# Patient Record
Sex: Male | Born: 1970
Health system: Southern US, Community
[De-identification: ages and names within clinical notes are randomized; demographics above are authoritative.]

## PROBLEM LIST (undated history)

## (undated) DIAGNOSIS — K219 Gastro-esophageal reflux disease without esophagitis: Secondary | ICD-10-CM

## (undated) DIAGNOSIS — G4730 Sleep apnea, unspecified: Secondary | ICD-10-CM

## (undated) DIAGNOSIS — R011 Cardiac murmur, unspecified: Secondary | ICD-10-CM

## (undated) DIAGNOSIS — E7800 Pure hypercholesterolemia, unspecified: Secondary | ICD-10-CM

## (undated) DIAGNOSIS — I10 Essential (primary) hypertension: Secondary | ICD-10-CM

## (undated) DIAGNOSIS — F419 Anxiety disorder, unspecified: Secondary | ICD-10-CM

## (undated) DIAGNOSIS — R51 Headache: Secondary | ICD-10-CM

## (undated) HISTORY — PX: OTHER SURGICAL HISTORY: SHX169

## (undated) HISTORY — DX: Pure hypercholesterolemia, unspecified: E78.00

## (undated) HISTORY — PX: WISDOM TOOTH EXTRACTION: SHX21

---

## 1997-09-02 ENCOUNTER — Emergency Department (HOSPITAL_COMMUNITY): Admission: EM | Admit: 1997-09-02 | Discharge: 1997-09-02 | Payer: Self-pay | Admitting: Emergency Medicine

## 2000-09-13 ENCOUNTER — Ambulatory Visit (HOSPITAL_COMMUNITY): Admission: RE | Admit: 2000-09-13 | Discharge: 2000-09-13 | Payer: Self-pay | Admitting: Otolaryngology

## 2000-09-13 ENCOUNTER — Encounter: Payer: Self-pay | Admitting: Otolaryngology

## 2001-07-27 ENCOUNTER — Ambulatory Visit (HOSPITAL_BASED_OUTPATIENT_CLINIC_OR_DEPARTMENT_OTHER): Admission: RE | Admit: 2001-07-27 | Discharge: 2001-07-27 | Payer: Self-pay | Admitting: Family Medicine

## 2006-08-01 ENCOUNTER — Ambulatory Visit (HOSPITAL_COMMUNITY): Admission: RE | Admit: 2006-08-01 | Discharge: 2006-08-01 | Payer: Self-pay | Admitting: Family Medicine

## 2007-11-19 ENCOUNTER — Ambulatory Visit (HOSPITAL_COMMUNITY): Admission: RE | Admit: 2007-11-19 | Discharge: 2007-11-19 | Payer: Self-pay | Admitting: Family Medicine

## 2007-11-23 ENCOUNTER — Ambulatory Visit (HOSPITAL_COMMUNITY): Admission: RE | Admit: 2007-11-23 | Discharge: 2007-11-23 | Payer: Self-pay | Admitting: Family Medicine

## 2009-04-15 ENCOUNTER — Ambulatory Visit (HOSPITAL_COMMUNITY): Admission: RE | Admit: 2009-04-15 | Discharge: 2009-04-15 | Payer: Self-pay | Admitting: Family Medicine

## 2009-06-05 ENCOUNTER — Ambulatory Visit (HOSPITAL_COMMUNITY): Admission: RE | Admit: 2009-06-05 | Discharge: 2009-06-05 | Payer: Self-pay | Admitting: Family Medicine

## 2010-05-09 ENCOUNTER — Encounter: Payer: Self-pay | Admitting: Family Medicine

## 2010-05-24 ENCOUNTER — Other Ambulatory Visit (HOSPITAL_COMMUNITY): Payer: Self-pay | Admitting: Family Medicine

## 2010-05-27 ENCOUNTER — Other Ambulatory Visit (HOSPITAL_COMMUNITY): Payer: Self-pay

## 2010-05-31 ENCOUNTER — Ambulatory Visit (HOSPITAL_COMMUNITY)
Admission: RE | Admit: 2010-05-31 | Discharge: 2010-05-31 | Disposition: A | Discharge status: Home / Self Care | Payer: Managed Care, Other (non HMO) | Source: Ambulatory Visit | Attending: Family Medicine | Admitting: Family Medicine

## 2010-05-31 DIAGNOSIS — R109 Unspecified abdominal pain: Secondary | ICD-10-CM | POA: Insufficient documentation

## 2010-07-06 ENCOUNTER — Ambulatory Visit (INDEPENDENT_AMBULATORY_CARE_PROVIDER_SITE_OTHER): Payer: Self-pay | Admitting: Internal Medicine

## 2012-06-06 ENCOUNTER — Encounter (INDEPENDENT_AMBULATORY_CARE_PROVIDER_SITE_OTHER): Payer: Self-pay | Admitting: *Deleted

## 2012-06-28 ENCOUNTER — Encounter (INDEPENDENT_AMBULATORY_CARE_PROVIDER_SITE_OTHER): Payer: Self-pay | Admitting: Internal Medicine

## 2012-06-28 ENCOUNTER — Encounter (INDEPENDENT_AMBULATORY_CARE_PROVIDER_SITE_OTHER): Payer: Self-pay | Admitting: *Deleted

## 2012-06-28 ENCOUNTER — Other Ambulatory Visit (INDEPENDENT_AMBULATORY_CARE_PROVIDER_SITE_OTHER): Payer: Self-pay | Admitting: *Deleted

## 2012-06-28 ENCOUNTER — Ambulatory Visit (INDEPENDENT_AMBULATORY_CARE_PROVIDER_SITE_OTHER): Payer: Managed Care, Other (non HMO) | Admitting: Internal Medicine

## 2012-06-28 VITALS — BP 112/62 | HR 65 | Temp 98.6°F | Ht 70.0 in | Wt 180.6 lb

## 2012-06-28 DIAGNOSIS — E7800 Pure hypercholesterolemia, unspecified: Secondary | ICD-10-CM | POA: Insufficient documentation

## 2012-06-28 DIAGNOSIS — I10 Essential (primary) hypertension: Secondary | ICD-10-CM | POA: Insufficient documentation

## 2012-06-28 DIAGNOSIS — G4730 Sleep apnea, unspecified: Secondary | ICD-10-CM | POA: Insufficient documentation

## 2012-06-28 DIAGNOSIS — K219 Gastro-esophageal reflux disease without esophagitis: Secondary | ICD-10-CM | POA: Insufficient documentation

## 2012-06-28 DIAGNOSIS — G4710 Hypersomnia, unspecified: Secondary | ICD-10-CM | POA: Insufficient documentation

## 2012-06-28 NOTE — Patient Instructions (Signed)
EGD. The risks and benefits such as perforation, bleeding, and infection were reviewed with the patient and is agreeable.The risks and benefits such as perforation, bleeding, and infection were reviewed with the patient and is agreeable. 

## 2012-06-28 NOTE — Progress Notes (Signed)
Subjective:     Patient ID: Terry Moses, male   DOB: 1970/06/24, 42 y.o.   MRN: [id number]  HPI Referred to our office by Dr. Phillips Odor at [facility] for a EGD. For the past 6 months, he tells me he always feels bloated. Started on Omeprazole about 30 days and reflux is somewhat better. He has put on about 15 pounds in the past year.  He feels gassy all the time. He says everytime he eats, he will bloat. No related to any specific food. Appetite is good. He has a BM about twice a day. Normal size. No melena or bright red rectal bleeding.  He also c/o reflux frequently. He has had reflux for a couple of years.Sometimes the reflux was so bad it felt like somebody was stabbing him in his throat. No family hx of colon cancer.  He takes 6 Motrin a day for headaches.    Review of Systems  See hpi. Current Outpatient Prescriptions  Medication Sig Dispense Refill  . aspirin 81 MG tablet Take 81 mg by mouth daily.      Marland Kitchen omeprazole (PRILOSEC) 40 MG capsule Take 40 mg by mouth daily.      . simvastatin (ZOCOR) 20 MG tablet Take 20 mg by mouth every evening.      . valsartan-hydrochlorothiazide (DIOVAN-HCT) 320-25 MG per tablet Take 1 tablet by mouth daily.       No current facility-administered medications for this visit.   Past Medical History  Diagnosis Date  . High cholesterol   . High cholesterol    Past Surgical History  Procedure Laterality Date  . Varicele     Allergies  Allergen Reactions  . Penicillins     .      Objective:   Physical Exam There were no vitals filed for this visit.  Filed Vitals:   06/28/12 0932  BP: 112/62  Pulse: 65  Temp: 98.6 F (37 C)  Height: 5\' 10"  (1.778 m)  Weight: 180 lb 9.6 oz (81.92 kg)     Alert and oriented. Skin warm and dry. Oral mucosa is moist.   . Sclera anicteric, conjunctivae is pink. Thyroid not enlarged. No cervical lymphadenopathy. Lungs clear. Heart regular rate and rhythm.  Abdomen is soft. Bowel sounds are positive.  No hepatomegaly. No abdominal masses felt. No tenderness.  No edema to lower extremities.      Assessment:    GERD. Better now since starting the Omeprazole. Continues to have breakthru acid reflux.    Plan:    EGD.The risks and benefits such as perforation, bleeding, and infection were reviewed with the patient and is agreeable.

## 2012-07-11 ENCOUNTER — Ambulatory Visit (HOSPITAL_COMMUNITY)
Admission: RE | Admit: 2012-07-11 | Discharge: 2012-07-11 | Disposition: A | Discharge status: Home / Self Care | Payer: Managed Care, Other (non HMO) | Source: Ambulatory Visit | Attending: Internal Medicine | Admitting: Internal Medicine

## 2012-07-11 ENCOUNTER — Encounter (HOSPITAL_COMMUNITY)
Admission: RE | Disposition: A | Discharge status: Home / Self Care | Payer: Self-pay | Source: Ambulatory Visit | Attending: Internal Medicine

## 2012-07-11 ENCOUNTER — Encounter (HOSPITAL_COMMUNITY): Payer: Self-pay | Admitting: Pharmacy Technician

## 2012-07-11 ENCOUNTER — Encounter (HOSPITAL_COMMUNITY): Payer: Self-pay | Admitting: *Deleted

## 2012-07-11 DIAGNOSIS — R143 Flatulence: Secondary | ICD-10-CM

## 2012-07-11 DIAGNOSIS — R142 Eructation: Secondary | ICD-10-CM

## 2012-07-11 DIAGNOSIS — K219 Gastro-esophageal reflux disease without esophagitis: Secondary | ICD-10-CM | POA: Insufficient documentation

## 2012-07-11 DIAGNOSIS — K2970 Gastritis, unspecified, without bleeding: Secondary | ICD-10-CM

## 2012-07-11 DIAGNOSIS — R141 Gas pain: Secondary | ICD-10-CM | POA: Insufficient documentation

## 2012-07-11 DIAGNOSIS — Z791 Long term (current) use of non-steroidal anti-inflammatories (NSAID): Secondary | ICD-10-CM

## 2012-07-11 DIAGNOSIS — K2960 Other gastritis without bleeding: Secondary | ICD-10-CM | POA: Insufficient documentation

## 2012-07-11 DIAGNOSIS — I10 Essential (primary) hypertension: Secondary | ICD-10-CM | POA: Insufficient documentation

## 2012-07-11 DIAGNOSIS — K2990 Gastroduodenitis, unspecified, without bleeding: Secondary | ICD-10-CM

## 2012-07-11 HISTORY — DX: Essential (primary) hypertension: I10

## 2012-07-11 HISTORY — PX: ESOPHAGOGASTRODUODENOSCOPY: SHX5428

## 2012-07-11 HISTORY — DX: Anxiety disorder, unspecified: F41.9

## 2012-07-11 HISTORY — DX: Cardiac murmur, unspecified: R01.1

## 2012-07-11 HISTORY — DX: Gastro-esophageal reflux disease without esophagitis: K21.9

## 2012-07-11 HISTORY — DX: Sleep apnea, unspecified: G47.30

## 2012-07-11 HISTORY — DX: Headache: R51

## 2012-07-11 SURGERY — EGD (ESOPHAGOGASTRODUODENOSCOPY)
Anesthesia: Moderate Sedation

## 2012-07-11 MED ORDER — MEPERIDINE HCL 50 MG/ML IJ SOLN
INTRAMUSCULAR | Status: AC
  Filled 2012-07-11: qty 1

## 2012-07-11 MED ORDER — MIDAZOLAM HCL 5 MG/5ML IJ SOLN
INTRAMUSCULAR | Status: DC | PRN
  Administered 2012-07-11: 2 mg via INTRAVENOUS
  Administered 2012-07-11 (×2): 3 mg via INTRAVENOUS

## 2012-07-11 MED ORDER — SODIUM CHLORIDE 0.9 % IV SOLN
INTRAVENOUS | Status: DC
  Administered 2012-07-11: 1000 mL via INTRAVENOUS

## 2012-07-11 MED ORDER — MIDAZOLAM HCL 5 MG/5ML IJ SOLN
INTRAMUSCULAR | Status: AC
  Filled 2012-07-11: qty 10

## 2012-07-11 MED ORDER — STERILE WATER FOR IRRIGATION IR SOLN
Status: DC | PRN
  Administered 2012-07-11: 13:00:00

## 2012-07-11 MED ORDER — BUTAMBEN-TETRACAINE-BENZOCAINE 2-2-14 % EX AERO
INHALATION_SPRAY | CUTANEOUS | Status: DC | PRN
  Administered 2012-07-11: 2 via TOPICAL

## 2012-07-11 MED ORDER — MEPERIDINE HCL 25 MG/ML IJ SOLN
INTRAMUSCULAR | Status: DC | PRN
  Administered 2012-07-11 (×2): 25 mg via INTRAVENOUS

## 2012-07-11 NOTE — Op Note (Signed)
EGD PROCEDURE REPORT  PATIENT:  Terry Moses  MR#:  [id number] Birthdate:  12-13-1970, 42 y.o., male Endoscopist:  Dr. Malissa Hippo, MD Referred By:  Dr. Colette Ribas, MD Procedure Date: 07/11/2012  Procedure:   EGD  Indications:  Patient is 42 year old Caucasian male with atypical symptoms of GERD also has postprandial bloating every time he eats. He has been on daily ibuprofen chronically. He is undergoing diagnostic EGD. He is feeling some better with omeprazole.            Informed Consent:  The risks, benefits, alternatives & imponderables which include, but are not limited to, bleeding, infection, perforation, drug reaction and potential missed lesion have been reviewed.  The potential for biopsy, lesion removal, esophageal dilation, etc. have also been discussed.  Questions have been answered.  All parties agreeable.  Please see history & physical in medical record for more information.  Medications:  Demerol 50 mg IV Versed 8 mg IV Cetacaine spray topically for oropharyngeal anesthesia  Description of procedure:  The endoscope was introduced through the mouth and advanced to the second portion of the duodenum without difficulty or limitations. The mucosal surfaces were surveyed very carefully during advancement of the scope and upon withdrawal.  Findings:  Esophagus:  Mucosa of the esophagus was normal. Wavy but unremarkable GE junction. GEJ:  41 cm Stomach:  Stomach was empty and distended very well with insufflation. Folds in the proximal stomach are normal. Examination of mucosa at body was normal. Patchy antral erythema noted but no erosions or ulcers identified. Pyloric channel was patent. Angularis, fundus and cardia were examined by retroflexing the scope and were normal. Duodenum:  Normal bulbar and post bulbar mucosa.  Therapeutic/Diagnostic Maneuvers Performed:  None  Complications:  None  Impression: No evidence of erosive esophagitis. Nonerosive antral  gastritis no evidence of peptic ulcer disease.  Recommendations:  Continue anti-reflux measures and omeprazole 40 mg by mouth every morning. H. pylori serology. Patient advised to keep ibuprofen use to minimum and discuss other treatment options for chronic headache with Dr. Phillips Odor.  Flynn Lininger U  07/11/2012  1:41 PM  CC: Dr. Phillips Odor, Chancy Hurter, MD & Dr. Bonnetta Barry ref. provider found

## 2012-07-11 NOTE — H&P (Signed)
Terry Moses is an 42 y.o. male.   Chief Complaint: Patient is here for EGD. HPI: Patient is 42 year old Caucasian male who presents with 6 month history of postprandial bloating and intermittent throat pain. He is feeling 50% better with omeprazole. He has been on ibuprofen almost on daily basis 10 years. Lately he is cut back the dose to 600 mg twice daily he takes for headache. He denies nausea vomiting melena rectal bleeding anorexia or weight loss.he smokes about 5 cigarettes per day and trying to quit he drinks alcohol socially but not daily.   Past Medical History  Diagnosis Date  . High cholesterol   . High cholesterol   . Hypertension   . Anxiety   . Sleep apnea   . Heart murmur   . GERD (gastroesophageal reflux disease)   . Headache     Past Surgical History  Procedure Laterality Date  . Varicele      Family History  Problem Relation Age of Onset  . Hyperlipidemia Mother   . Coronary artery disease Father   . COPD Father    Social History:  reports that he has been smoking Cigarettes.  He has a 12.5 pack-year smoking history. He does not have any smokeless tobacco history on file. He reports that  drinks alcohol. He reports that he does not use illicit drugs.  Allergies:  Allergies  Allergen Reactions  . Penicillins Anaphylaxis    Childhood allergy.    Medications Prior to Admission  Medication Sig Dispense Refill  . acetaminophen (TYLENOL) 500 MG tablet Take 1,000 mg by mouth every 6 (six) hours as needed for pain.      Marland Kitchen ibuprofen (ADVIL,MOTRIN) 200 MG tablet Take 200 mg by mouth every 6 (six) hours as needed for pain.      . Multiple Vitamin (MULTIVITAMIN) capsule Take 1 capsule by mouth daily.      Marland Kitchen omeprazole (PRILOSEC) 40 MG capsule Take 40 mg by mouth daily.      . simvastatin (ZOCOR) 20 MG tablet Take 20 mg by mouth every evening.      . valsartan-hydrochlorothiazide (DIOVAN-HCT) 320-25 MG per tablet Take 1 tablet by mouth daily.        No results  found for this or any previous visit (from the past 48 hour(s)). No results found.  ROS  Blood pressure 137/89, pulse 76, temperature 98.2 F (36.8 C), temperature source Oral, resp. rate 18, height 5\' 10"  (1.778 m), weight 180 lb (81.647 kg), SpO2 96.00%. Physical Exam  Constitutional: He appears well-developed and well-nourished.  HENT:  Mouth/Throat: Oropharynx is clear and moist.  Eyes: No scleral icterus.  Neck: No thyromegaly present.  Cardiovascular: Normal rate, regular rhythm and normal heart sounds.   No murmur heard. Respiratory: Effort normal and breath sounds normal.  GI: Soft. He exhibits no distension and no mass. There is no tenderness.  Musculoskeletal: He exhibits no edema.  Lymphadenopathy:    He has no cervical adenopathy.  Neurological: He is alert.  Skin: Skin is warm and dry.     Assessment/Plan Atypical symptoms of GERD . Postprandial bloating . Chronic NSAID use.  Diagnostic EGD.  Shawnell Dykes U 07/11/2012, 1:19 PM

## 2012-07-12 LAB — H. PYLORI ANTIBODY, IGG: H Pylori IgG: 0.52 {ISR}

## 2012-07-13 ENCOUNTER — Encounter (HOSPITAL_COMMUNITY): Payer: Self-pay | Admitting: Internal Medicine

## 2012-07-24 ENCOUNTER — Telehealth (INDEPENDENT_AMBULATORY_CARE_PROVIDER_SITE_OTHER): Payer: Self-pay | Admitting: *Deleted

## 2012-07-24 NOTE — Telephone Encounter (Addendum)
Korvin's call report - reflux is a lot better and has been on the Omeprazole for one month. The bloating, gas and pain is still there but not as bad as before. It is mainly after he eats. Still has an apaitie too.

## 2012-07-24 NOTE — Telephone Encounter (Signed)
Noted, will forward to Dr.Rehman to review (This is a Progress report)

## 2012-08-02 NOTE — Telephone Encounter (Signed)
Patient called and given Dr.Rehman's recommendation. Forwarded to Lupita Leash to make appointment for 1 month.

## 2012-08-02 NOTE — Telephone Encounter (Signed)
LM for patient to return the call to schedule a f/u apt.   

## 2012-08-02 NOTE — Telephone Encounter (Signed)
Since he is feeling better would continue with current therapy and plan to see him in office in one month. Please call patient

## 2012-08-15 NOTE — Telephone Encounter (Signed)
LM for patient to return the call to schedule a f/u apt.   

## 2012-08-21 ENCOUNTER — Encounter (INDEPENDENT_AMBULATORY_CARE_PROVIDER_SITE_OTHER): Payer: Self-pay | Admitting: Internal Medicine

## 2012-08-21 ENCOUNTER — Ambulatory Visit (INDEPENDENT_AMBULATORY_CARE_PROVIDER_SITE_OTHER): Payer: Managed Care, Other (non HMO) | Admitting: Internal Medicine

## 2012-08-21 VITALS — BP 110/80 | HR 76 | Temp 97.5°F | Resp 18 | Ht 70.0 in | Wt 176.5 lb

## 2012-08-21 DIAGNOSIS — R140 Abdominal distension (gaseous): Secondary | ICD-10-CM

## 2012-08-21 DIAGNOSIS — R143 Flatulence: Secondary | ICD-10-CM

## 2012-08-21 DIAGNOSIS — K219 Gastro-esophageal reflux disease without esophagitis: Secondary | ICD-10-CM

## 2012-08-21 MED ORDER — ACETAMINOPHEN 500 MG PO TABS: 1000.0000 mg | ORAL_TABLET | Freq: Three times a day (TID) | ORAL | Status: DC | PRN

## 2012-08-21 NOTE — Patient Instructions (Signed)
Call if symptoms relapse. Keep Tylenol use to 3 g or less per day.

## 2012-08-21 NOTE — Progress Notes (Signed)
Presenting complaint;  Followup for GERD and bloating.  Subjective:  Patient is 42 year old Caucasian male was initially seen about 8 weeks ago for postprandial bloating and history of heartburn. He did not is respond well to PPI. Ultrasound was negative for cholelithiasis. He underwent EGD on 07/11/2012 and noted to have nonerosive antral gastritis. H. pylori serology was negative. He was advised to continue antireflux measures and omeprazole. He was also advised to decrease ibuprofen use. Overall he feels better. He is having heartburn once or twice a week. His bloating is also decreased significantly but not completely gone away. He continues to have daily headaches. He is trying to take 2 ibuprofen on most days and on some he takes 3. He denies abdominal pain melena rectal bleeding or diarrhea. He believes his headache is secondary to sinusitis. He is agreeable to follow with Dr. Phillips Odor if headache is worse. He states his sleep study 3 years ago and was diagnosed with sleep apnea and he is using CPAP. this intervention has not helped his headache.  Current Medications: Current Outpatient Prescriptions  Medication Sig Dispense Refill  . acetaminophen (TYLENOL) 500 MG tablet Take 1,000 mg by mouth every 6 (six) hours as needed for pain.      Marland Kitchen ibuprofen (ADVIL,MOTRIN) 200 MG tablet Take 200 mg by mouth every 6 (six) hours as needed for pain.      . Multiple Vitamin (MULTIVITAMIN) capsule Take 1 capsule by mouth daily.      Marland Kitchen omeprazole (PRILOSEC) 40 MG capsule Take 40 mg by mouth daily.      . simvastatin (ZOCOR) 20 MG tablet Take 20 mg by mouth every evening.      . valsartan-hydrochlorothiazide (DIOVAN-HCT) 320-25 MG per tablet Take 1 tablet by mouth daily.       No current facility-administered medications for this visit.     Objective: Blood pressure 110/80, pulse 76, temperature 97.5 F (36.4 C), temperature source Oral, resp. rate 18, height 5\' 10"  (1.778 m), weight 176 lb 8 oz  (80.06 kg). Patient is alert and in no acute distress. Conjunctiva is pink. Sclera is nonicteric Oropharyngeal mucosa is normal. No neck masses or thyromegaly noted. Abdomen is soft and nontender without organomegaly or masses. No LE edema or clubbing noted.  Labs/studies Results: H. pylori serology was negative on 07/11/2012.  Assessment:  #1. GERD. Typical symptoms are well controlled with 40 mg of omeprazole daily. At some point in future dose would be decreased. #2. Bloating. This symptom may be related to aerophagia or consumption of flatulogenic foods. Ultrasound was negative for cholelithiasis. If this symptom worsens again we will consider HIDA scan with CCK and evaluation for SIBO.    Plan:  Continue antireflux measures. Continue omeprazole at 40 mg by mouth every morning. Keep ibuprofen use to minimum. Can alternate with Tylenol. Patient advised not to take more than 3 g of Tylenol on any given day. Office visit in 6 months unless symptoms worsen.

## 2013-02-19 ENCOUNTER — Ambulatory Visit (INDEPENDENT_AMBULATORY_CARE_PROVIDER_SITE_OTHER): Payer: Managed Care, Other (non HMO) | Admitting: Internal Medicine

## 2015-06-01 DIAGNOSIS — Z1389 Encounter for screening for other disorder: Secondary | ICD-10-CM | POA: Diagnosis not present

## 2015-06-01 DIAGNOSIS — E782 Mixed hyperlipidemia: Secondary | ICD-10-CM | POA: Diagnosis not present

## 2015-06-01 DIAGNOSIS — Z719 Counseling, unspecified: Secondary | ICD-10-CM | POA: Diagnosis not present

## 2015-06-01 DIAGNOSIS — E663 Overweight: Secondary | ICD-10-CM | POA: Diagnosis not present

## 2015-06-01 DIAGNOSIS — Z6826 Body mass index (BMI) 26.0-26.9, adult: Secondary | ICD-10-CM | POA: Diagnosis not present

## 2015-06-12 ENCOUNTER — Other Ambulatory Visit (HOSPITAL_COMMUNITY): Payer: Self-pay | Admitting: Family Medicine

## 2015-06-19 MED FILL — OMEPRAZOLE DR 40 MG CAPSULE: 40 | 90 days supply | Qty: 90 | Fill #1

## 2015-07-21 MED FILL — PRAVASTATIN SODIUM 20 MG TA: 20 | 90 days supply | Qty: 90 | Fill #1

## 2015-10-02 MED FILL — OMEPRAZOLE DR 40 MG CAPSULE: 40 | 90 days supply | Qty: 90 | Fill #2

## 2015-11-13 MED FILL — PRAVASTATIN SODIUM 20 MG TA: 20 | 90 days supply | Qty: 90 | Fill #2

## 2016-01-06 MED FILL — VALSARTAN-HCTZ 320-25 MG TA: 320-25 | 90 days supply | Qty: 90 | Fill #1

## 2016-01-12 MED FILL — OMEPRAZOLE DR 40 MG CAPSULE: 40 | 90 days supply | Qty: 90 | Fill #3

## 2016-03-22 DIAGNOSIS — Z0001 Encounter for general adult medical examination with abnormal findings: Secondary | ICD-10-CM | POA: Diagnosis not present

## 2016-03-22 DIAGNOSIS — E785 Hyperlipidemia, unspecified: Secondary | ICD-10-CM | POA: Diagnosis not present

## 2016-03-22 DIAGNOSIS — I10 Essential (primary) hypertension: Secondary | ICD-10-CM | POA: Diagnosis not present

## 2016-03-22 DIAGNOSIS — K589 Irritable bowel syndrome without diarrhea: Secondary | ICD-10-CM | POA: Diagnosis not present

## 2016-03-22 DIAGNOSIS — Z1389 Encounter for screening for other disorder: Secondary | ICD-10-CM | POA: Diagnosis not present

## 2016-03-22 DIAGNOSIS — R51 Headache: Secondary | ICD-10-CM | POA: Diagnosis not present

## 2016-03-22 DIAGNOSIS — Z719 Counseling, unspecified: Secondary | ICD-10-CM | POA: Diagnosis not present

## 2016-03-22 DIAGNOSIS — Z6824 Body mass index (BMI) 24.0-24.9, adult: Secondary | ICD-10-CM | POA: Diagnosis not present

## 2016-03-22 MED FILL — PRAVASTATIN SODIUM 20 MG TA: 20 | 90 days supply | Qty: 90 | Fill #0

## 2016-03-22 MED FILL — TOPIRAMATE 25 MG TABLET: 25 | 30 days supply | Qty: 60 | Fill #0

## 2016-03-22 MED FILL — VALSARTAN-HCTZ 320-25 MG TA: 320-25 | 90 days supply | Qty: 90 | Fill #0

## 2016-03-23 ENCOUNTER — Other Ambulatory Visit (HOSPITAL_COMMUNITY): Payer: Self-pay | Admitting: Family Medicine

## 2016-03-23 DIAGNOSIS — K219 Gastro-esophageal reflux disease without esophagitis: Secondary | ICD-10-CM | POA: Diagnosis not present

## 2016-03-23 DIAGNOSIS — K589 Irritable bowel syndrome without diarrhea: Secondary | ICD-10-CM | POA: Diagnosis not present

## 2016-03-23 DIAGNOSIS — Z0001 Encounter for general adult medical examination with abnormal findings: Secondary | ICD-10-CM | POA: Diagnosis not present

## 2016-03-23 DIAGNOSIS — Z1389 Encounter for screening for other disorder: Secondary | ICD-10-CM | POA: Diagnosis not present

## 2016-04-13 MED FILL — OMEPRAZOLE DR 40 MG CAPSULE: 40 | 90 days supply | Qty: 90 | Fill #0

## 2016-05-03 DIAGNOSIS — D225 Melanocytic nevi of trunk: Secondary | ICD-10-CM | POA: Diagnosis not present

## 2016-05-03 DIAGNOSIS — D485 Neoplasm of uncertain behavior of skin: Secondary | ICD-10-CM | POA: Diagnosis not present

## 2016-05-03 DIAGNOSIS — Z1283 Encounter for screening for malignant neoplasm of skin: Secondary | ICD-10-CM | POA: Diagnosis not present

## 2016-05-03 DIAGNOSIS — L218 Other seborrheic dermatitis: Secondary | ICD-10-CM | POA: Diagnosis not present

## 2016-05-05 MED FILL — FLUTICASONE PROP 0.05% CRM: 0.05 | 30 days supply | Qty: 60 | Fill #0

## 2016-05-11 DIAGNOSIS — D485 Neoplasm of uncertain behavior of skin: Secondary | ICD-10-CM | POA: Diagnosis not present

## 2016-05-11 DIAGNOSIS — D2261 Melanocytic nevi of right upper limb, including shoulder: Secondary | ICD-10-CM | POA: Diagnosis not present

## 2016-05-11 DIAGNOSIS — L98429 Non-pressure chronic ulcer of back with unspecified severity: Secondary | ICD-10-CM | POA: Diagnosis not present

## 2016-07-21 MED FILL — OMEPRAZOLE DR 40 MG CAPSULE: 40 | 90 days supply | Qty: 90 | Fill #1

## 2016-09-01 MED FILL — PRAVASTATIN SODIUM 20 MG TA: 20 | 90 days supply | Qty: 90 | Fill #1

## 2016-10-26 MED FILL — OMEPRAZOLE DR 40 MG CAPSULE: 40 | 90 days supply | Qty: 90 | Fill #2

## 2017-02-03 MED FILL — OMEPRAZOLE DR 40 MG CAPSULE: 40 | 90 days supply | Qty: 90 | Fill #3

## 2017-03-23 DIAGNOSIS — Z0000 Encounter for general adult medical examination without abnormal findings: Secondary | ICD-10-CM | POA: Diagnosis not present

## 2017-03-23 DIAGNOSIS — Z1389 Encounter for screening for other disorder: Secondary | ICD-10-CM | POA: Diagnosis not present

## 2017-03-23 DIAGNOSIS — H524 Presbyopia: Secondary | ICD-10-CM | POA: Diagnosis not present

## 2017-03-23 DIAGNOSIS — Z23 Encounter for immunization: Secondary | ICD-10-CM | POA: Diagnosis not present

## 2017-03-23 DIAGNOSIS — Z6824 Body mass index (BMI) 24.0-24.9, adult: Secondary | ICD-10-CM | POA: Diagnosis not present

## 2017-03-23 DIAGNOSIS — G4730 Sleep apnea, unspecified: Secondary | ICD-10-CM | POA: Diagnosis not present

## 2017-03-23 DIAGNOSIS — K219 Gastro-esophageal reflux disease without esophagitis: Secondary | ICD-10-CM | POA: Diagnosis not present

## 2017-03-23 DIAGNOSIS — K589 Irritable bowel syndrome without diarrhea: Secondary | ICD-10-CM | POA: Diagnosis not present

## 2017-03-23 DIAGNOSIS — R7309 Other abnormal glucose: Secondary | ICD-10-CM | POA: Diagnosis not present

## 2017-03-24 MED FILL — ROSUVASTATIN CALCIUM 20 MG: 20 | 90 days supply | Qty: 90 | Fill #0 | Status: TO

## 2017-04-12 ENCOUNTER — Ambulatory Visit (INDEPENDENT_AMBULATORY_CARE_PROVIDER_SITE_OTHER): Payer: Managed Care, Other (non HMO) | Admitting: Internal Medicine

## 2017-04-27 MED FILL — VALSARTAN-HCTZ 320-25 MG TA: 320-25 | 90 days supply | Qty: 90 | Fill #0 | Status: TO

## 2017-04-27 MED FILL — OMEPRAZOLE DR 40 MG CAPSULE: 40 | 90 days supply | Qty: 90 | Fill #0 | Status: TO

## 2017-06-29 MED FILL — ROSUVASTATIN CALCIUM 20 MG: 20 | 90 days supply | Qty: 90 | Fill #0

## 2017-06-29 MED FILL — SHIPPING COST: 1 days supply | Qty: 1 | Fill #0

## 2017-08-30 MED FILL — OMEPRAZOLE 40 MG CPDR: 40 | 90 days supply | Qty: 90 | Fill #0

## 2017-08-30 MED FILL — SHIPPING COST: 1 days supply | Qty: 1 | Fill #1

## 2017-10-11 MED FILL — ROSUVASTATIN CALCIUM 20 MG: 20 | 90 days supply | Qty: 90 | Fill #0

## 2017-10-12 MED FILL — SHIPPING COST: 1 days supply | Qty: 1 | Fill #2

## 2017-11-20 MED FILL — VALSARTAN-HCTZ 320-25 MG TA: 320-25 | 90 days supply | Qty: 90 | Fill #0

## 2017-11-20 MED FILL — SHIPPING COST: 1 days supply | Qty: 1 | Fill #3

## 2017-11-20 MED FILL — OMEPRAZOLE 40 MG CPDR: 40 | 90 days supply | Qty: 90 | Fill #1

## 2017-11-27 DIAGNOSIS — E785 Hyperlipidemia, unspecified: Secondary | ICD-10-CM | POA: Diagnosis not present

## 2017-11-27 DIAGNOSIS — Z6825 Body mass index (BMI) 25.0-25.9, adult: Secondary | ICD-10-CM | POA: Diagnosis not present

## 2017-11-27 DIAGNOSIS — E663 Overweight: Secondary | ICD-10-CM | POA: Diagnosis not present

## 2017-11-27 DIAGNOSIS — Z1389 Encounter for screening for other disorder: Secondary | ICD-10-CM | POA: Diagnosis not present

## 2017-11-27 DIAGNOSIS — E782 Mixed hyperlipidemia: Secondary | ICD-10-CM | POA: Diagnosis not present

## 2017-12-11 DIAGNOSIS — Z1389 Encounter for screening for other disorder: Secondary | ICD-10-CM | POA: Diagnosis not present

## 2017-12-11 DIAGNOSIS — E663 Overweight: Secondary | ICD-10-CM | POA: Diagnosis not present

## 2017-12-11 DIAGNOSIS — L239 Allergic contact dermatitis, unspecified cause: Secondary | ICD-10-CM | POA: Diagnosis not present

## 2017-12-11 DIAGNOSIS — Z6825 Body mass index (BMI) 25.0-25.9, adult: Secondary | ICD-10-CM | POA: Diagnosis not present

## 2018-02-13 MED FILL — SHIPPING COST: 1 days supply | Qty: 1 | Fill #4

## 2018-02-13 MED FILL — ROSUVASTATIN CALCIUM 20 MG: 20 | 90 days supply | Qty: 90 | Fill #0

## 2018-02-15 DIAGNOSIS — J390 Retropharyngeal and parapharyngeal abscess: Secondary | ICD-10-CM | POA: Diagnosis not present

## 2018-02-15 DIAGNOSIS — Z6825 Body mass index (BMI) 25.0-25.9, adult: Secondary | ICD-10-CM | POA: Diagnosis not present

## 2018-02-15 DIAGNOSIS — Z1389 Encounter for screening for other disorder: Secondary | ICD-10-CM | POA: Diagnosis not present

## 2018-02-15 DIAGNOSIS — E663 Overweight: Secondary | ICD-10-CM | POA: Diagnosis not present

## 2018-03-01 ENCOUNTER — Ambulatory Visit (INDEPENDENT_AMBULATORY_CARE_PROVIDER_SITE_OTHER): Payer: 59 | Admitting: Otolaryngology

## 2018-03-01 DIAGNOSIS — Q892 Congenital malformations of other endocrine glands: Secondary | ICD-10-CM | POA: Diagnosis not present

## 2018-03-01 DIAGNOSIS — R070 Pain in throat: Secondary | ICD-10-CM

## 2018-03-02 ENCOUNTER — Other Ambulatory Visit (INDEPENDENT_AMBULATORY_CARE_PROVIDER_SITE_OTHER): Payer: Self-pay | Admitting: Otolaryngology

## 2018-03-02 DIAGNOSIS — R221 Localized swelling, mass and lump, neck: Secondary | ICD-10-CM

## 2018-03-09 MED FILL — SHIPPING COST: 1 days supply | Qty: 1 | Fill #5

## 2018-03-09 MED FILL — OMEPRAZOLE 40 MG CPDR: 40 | 90 days supply | Qty: 90 | Fill #2

## 2018-03-16 ENCOUNTER — Ambulatory Visit (HOSPITAL_COMMUNITY)
Admission: RE | Admit: 2018-03-16 | Discharge: 2018-03-16 | Disposition: A | Discharge status: Home / Self Care | Payer: 59 | Source: Ambulatory Visit | Attending: Otolaryngology | Admitting: Otolaryngology

## 2018-03-16 ENCOUNTER — Encounter (HOSPITAL_COMMUNITY): Payer: Self-pay

## 2018-03-16 DIAGNOSIS — R221 Localized swelling, mass and lump, neck: Secondary | ICD-10-CM | POA: Diagnosis not present

## 2018-03-16 DIAGNOSIS — Q892 Congenital malformations of other endocrine glands: Secondary | ICD-10-CM | POA: Diagnosis not present

## 2018-03-16 LAB — POCT I-STAT CREATININE: Creatinine, Ser: 1 mg/dL (ref 0.61–1.24)

## 2018-03-16 MED ORDER — IOHEXOL 300 MG/ML  SOLN
75.0000 mL | Freq: Once | INTRAMUSCULAR | Status: AC | PRN
  Administered 2018-03-16: 75 mL via INTRAVENOUS

## 2018-04-02 DIAGNOSIS — K589 Irritable bowel syndrome without diarrhea: Secondary | ICD-10-CM | POA: Diagnosis not present

## 2018-04-02 DIAGNOSIS — Z720 Tobacco use: Secondary | ICD-10-CM | POA: Diagnosis not present

## 2018-04-02 DIAGNOSIS — I10 Essential (primary) hypertension: Secondary | ICD-10-CM | POA: Diagnosis not present

## 2018-04-02 DIAGNOSIS — Q892 Congenital malformations of other endocrine glands: Secondary | ICD-10-CM | POA: Diagnosis not present

## 2018-04-02 DIAGNOSIS — E782 Mixed hyperlipidemia: Secondary | ICD-10-CM | POA: Diagnosis not present

## 2018-04-02 DIAGNOSIS — R7309 Other abnormal glucose: Secondary | ICD-10-CM | POA: Diagnosis not present

## 2018-04-02 DIAGNOSIS — R001 Bradycardia, unspecified: Secondary | ICD-10-CM | POA: Diagnosis not present

## 2018-04-02 DIAGNOSIS — R5383 Other fatigue: Secondary | ICD-10-CM | POA: Diagnosis not present

## 2018-04-02 DIAGNOSIS — E663 Overweight: Secondary | ICD-10-CM | POA: Diagnosis not present

## 2018-04-02 DIAGNOSIS — Z0001 Encounter for general adult medical examination with abnormal findings: Secondary | ICD-10-CM | POA: Diagnosis not present

## 2018-04-02 DIAGNOSIS — Z23 Encounter for immunization: Secondary | ICD-10-CM | POA: Diagnosis not present

## 2018-04-02 DIAGNOSIS — Z1389 Encounter for screening for other disorder: Secondary | ICD-10-CM | POA: Diagnosis not present

## 2018-04-02 DIAGNOSIS — Z6825 Body mass index (BMI) 25.0-25.9, adult: Secondary | ICD-10-CM | POA: Diagnosis not present

## 2018-04-02 MED FILL — VALSARTAN 160 MG TABLET: 160 | 90 days supply | Qty: 90 | Fill #0

## 2018-04-02 MED FILL — SHIPPING COST: 1 days supply | Qty: 1 | Fill #6

## 2018-04-05 ENCOUNTER — Ambulatory Visit (INDEPENDENT_AMBULATORY_CARE_PROVIDER_SITE_OTHER): Payer: 59 | Admitting: Otolaryngology

## 2018-04-05 DIAGNOSIS — Q892 Congenital malformations of other endocrine glands: Secondary | ICD-10-CM

## 2019-03-28 DIAGNOSIS — Z719 Counseling, unspecified: Secondary | ICD-10-CM | POA: Diagnosis not present

## 2019-03-28 DIAGNOSIS — Z6824 Body mass index (BMI) 24.0-24.9, adult: Secondary | ICD-10-CM | POA: Diagnosis not present

## 2019-03-28 DIAGNOSIS — R001 Bradycardia, unspecified: Secondary | ICD-10-CM | POA: Diagnosis not present

## 2019-03-28 DIAGNOSIS — Z0001 Encounter for general adult medical examination with abnormal findings: Secondary | ICD-10-CM | POA: Diagnosis not present

## 2019-03-28 DIAGNOSIS — E785 Hyperlipidemia, unspecified: Secondary | ICD-10-CM | POA: Diagnosis not present

## 2019-03-28 DIAGNOSIS — I10 Essential (primary) hypertension: Secondary | ICD-10-CM | POA: Diagnosis not present

## 2019-04-03 ENCOUNTER — Encounter (INDEPENDENT_AMBULATORY_CARE_PROVIDER_SITE_OTHER): Payer: Self-pay | Admitting: *Deleted

## 2019-12-31 IMAGING — CT CT NECK W/ CM
4 of 5 series · 16 of 33 positions shown, 18 images · IV contrast (omnipaque)
Comparison: None.

CLINICAL DATA: Anterior neck mass

EXAM:
CT NECK WITH CONTRAST
TECHNIQUE: Multidetector CT imaging of the neck was performed using the
standard protocol following the bolus administration of intravenous
contrast.
CONTRAST:  75mL OMNIPAQUE IOHEXOL 300 MG/ML  SOLN

[Series 2: axial neck · axial · 0.58mm/px · z∈[+1233,+1389]mm · 4 of 130 slices shown, 5 images]
[im 26/130  soft-tissue]
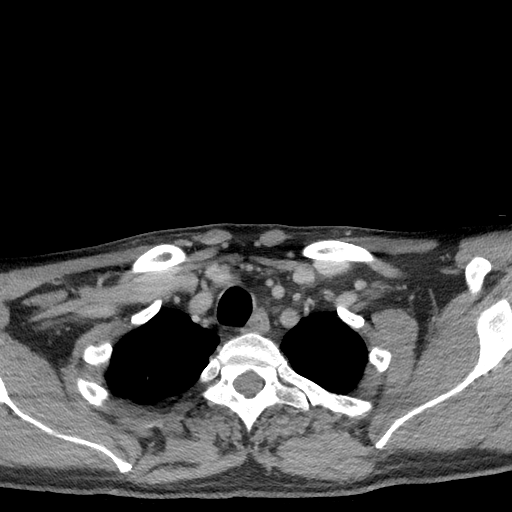
[im 26/130  bone]
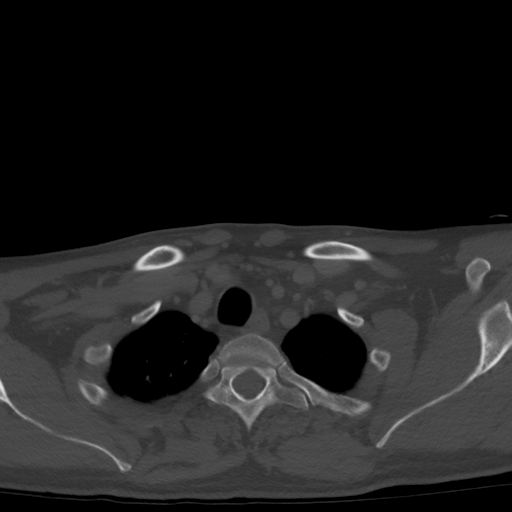
[im 52/130  bone]
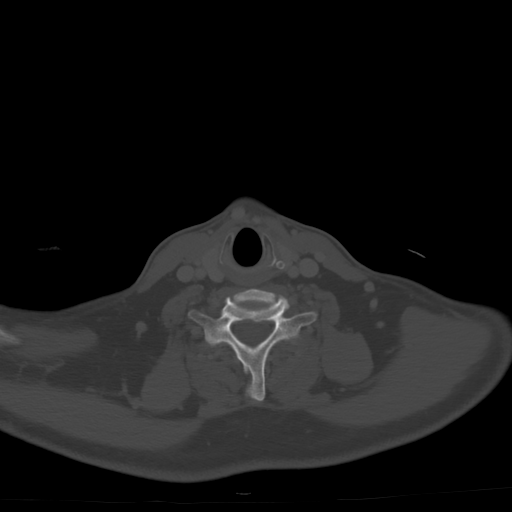
[im 78/130  bone]
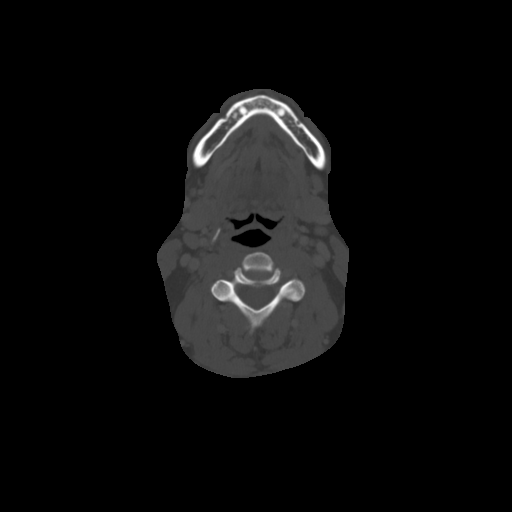
[im 104/130  bone]
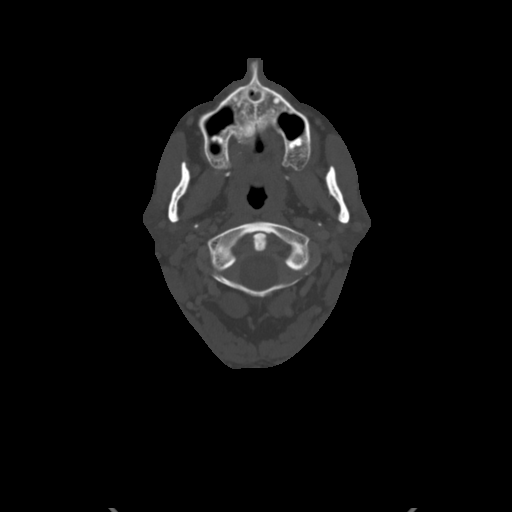

[Series 6: coronal neck · coronal · 0.52mm/px · 3 of 134 slices shown]
[im 27/134  bone]
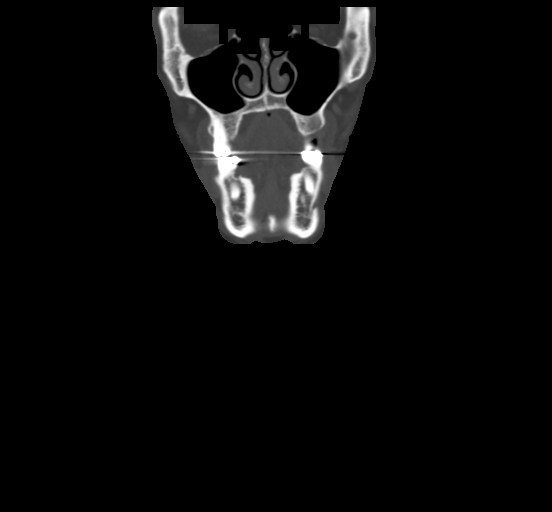
[im 54/134  bone]
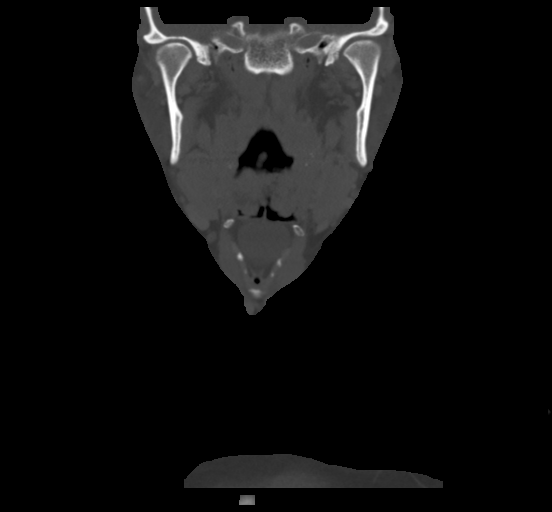
[im 80/134  bone]
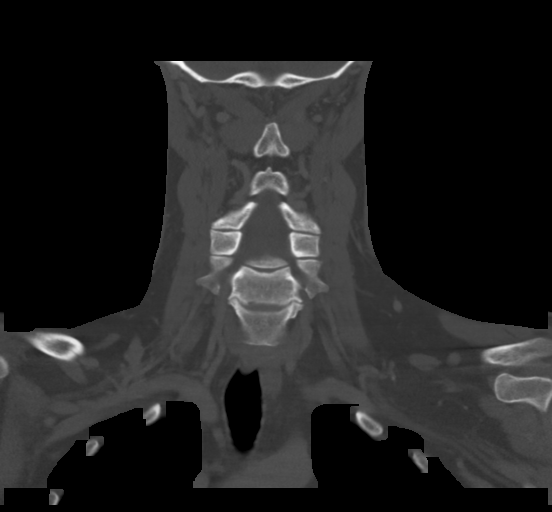

[Series 7: sagittal neck · sagittal · 0.52mm/px · 5 of 105 slices shown, 6 images]
[im 35/105  bone]
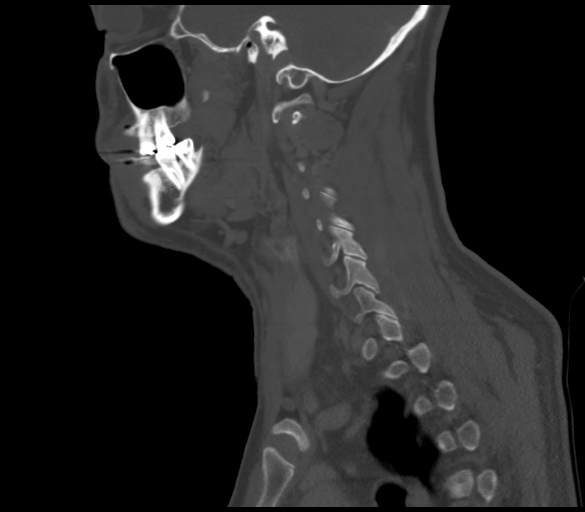
[im 44/105  bone]
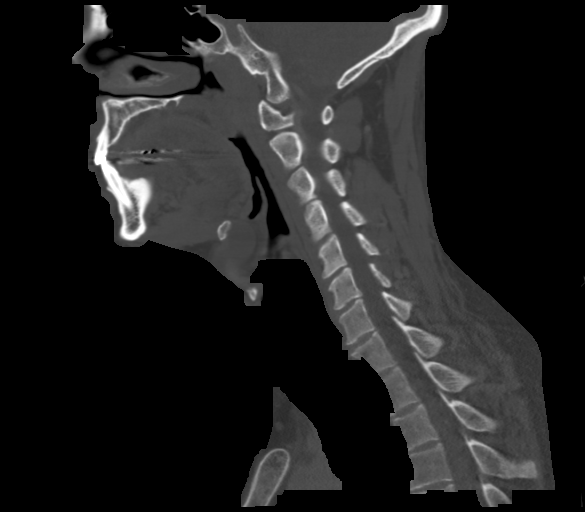
[im 53/105  soft-tissue]
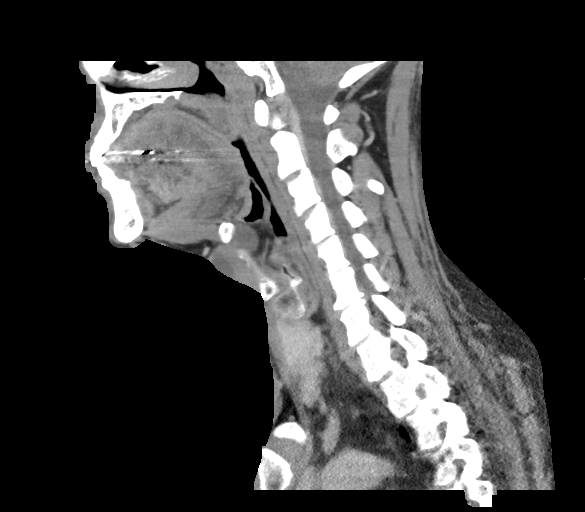
[im 53/105  bone]
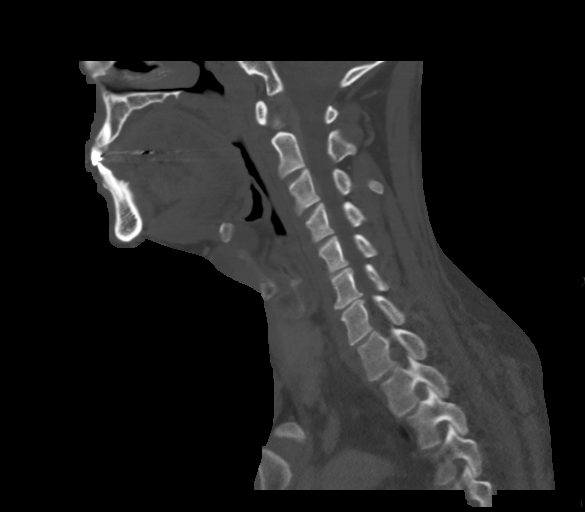
[im 61/105  bone]
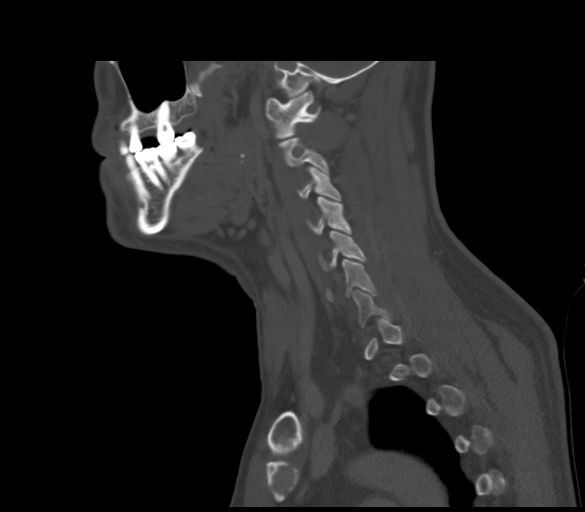
[im 70/105  bone]
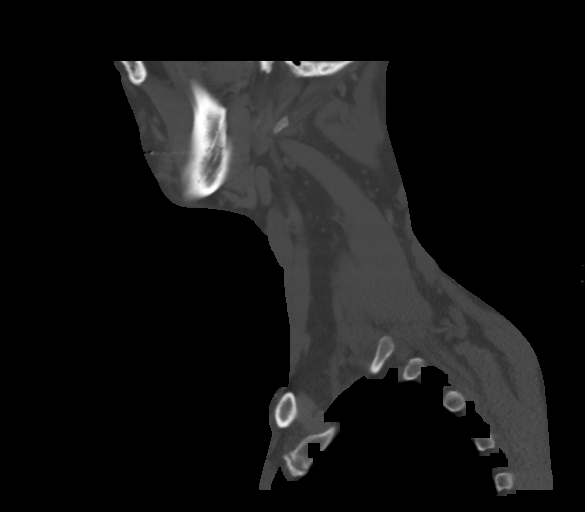

[Series 8: orthogonal ax · axial · 0.39mm/px · z∈[+1198,+1349]mm · 4 of 134 slices shown]
[im 27/134  bone]
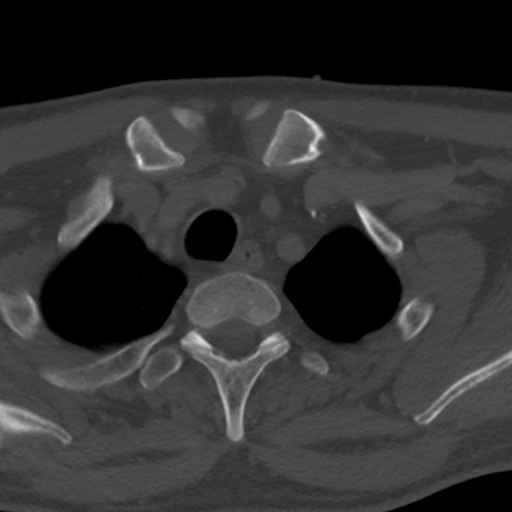
[im 54/134  bone]
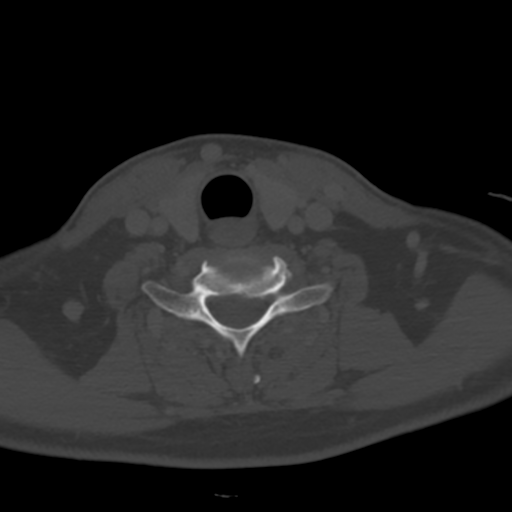
[im 80/134  bone]
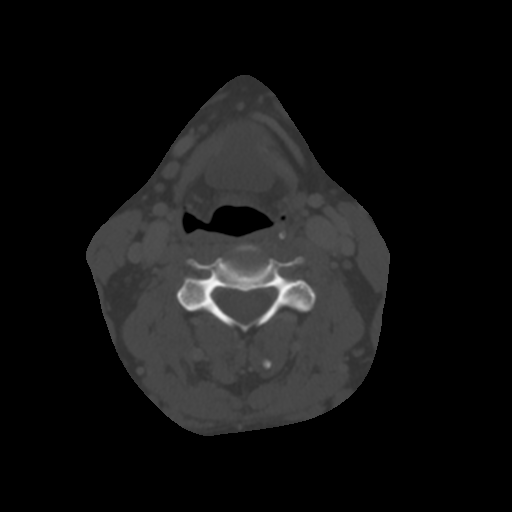
[im 107/134  bone]
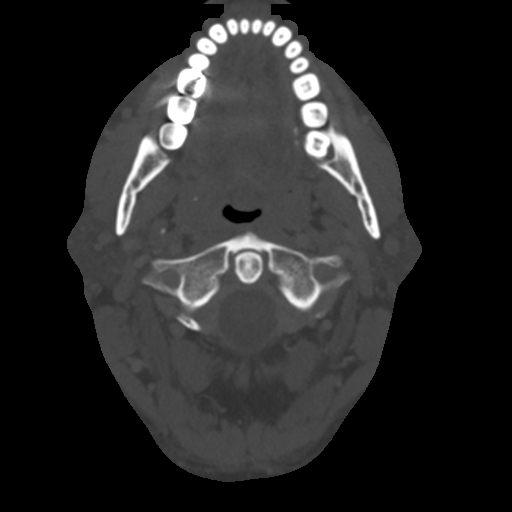

[16 of 33 positions shown; findings below may reference images not displayed]

FINDINGS: PHARYNX AND LARYNX:

--Nasopharynx: Fossae of [place] are clear. Normal adenoid
tonsils for age.

--Oral cavity and oropharynx: The palatine and lingual tonsils are
normal. The visible oral cavity and floor of mouth are normal.

--Hypopharynx: Normal vallecula and pyriform sinuses.

--Larynx: Normal epiglottis and pre-epiglottic space. Normal
aryepiglottic and vocal folds.

--Retropharyngeal space: No abscess, effusion or lymphadenopathy.

SALIVARY GLANDS:

--Parotid: No mass lesion or inflammation. No sialolithiasis or
ductal dilatation.

--Submandibular: Symmetric without inflammation. No sialolithiasis
or ductal dilatation.

--Sublingual: Normal. No ranula or other visible lesion of the base
of tongue and floor of mouth.

THYROID: Normal.

LYMPH NODES: No enlarged or abnormal density lymph nodes.

VASCULAR: Major cervical vessels are patent.

LIMITED INTRACRANIAL: Normal.

VISUALIZED ORBITS: Normal.

MASTOIDS AND VISUALIZED PARANASAL SINUSES: No fluid levels or
advanced mucosal thickening. No mastoid effusion.

SKELETON: No bony spinal canal stenosis. No lytic or blastic
lesions.

UPPER CHEST: Clear.

OTHER: At the level of the hyoid bone, there is a cystic lesion that
extends from the pre epiglottic space through the midline portion of
the hyoid bone. The lesion measures approximately 2.7 x 2.7 x
cm. There is no internal contrast enhancement or surrounding soft
tissue abnormality.
IMPRESSION: Thyroglossal duct cyst extending from the pre epiglottic space
through the hyoid bone.

## 2020-01-01 DIAGNOSIS — Z20828 Contact with and (suspected) exposure to other viral communicable diseases: Secondary | ICD-10-CM | POA: Diagnosis not present

## 2020-01-09 ENCOUNTER — Ambulatory Visit: Payer: 59

## 2020-03-30 DIAGNOSIS — E7849 Other hyperlipidemia: Secondary | ICD-10-CM | POA: Diagnosis not present

## 2020-03-30 DIAGNOSIS — Z0001 Encounter for general adult medical examination with abnormal findings: Secondary | ICD-10-CM | POA: Diagnosis not present

## 2020-03-30 DIAGNOSIS — Z6825 Body mass index (BMI) 25.0-25.9, adult: Secondary | ICD-10-CM | POA: Diagnosis not present

## 2020-03-30 DIAGNOSIS — Z1389 Encounter for screening for other disorder: Secondary | ICD-10-CM | POA: Diagnosis not present

## 2020-03-30 DIAGNOSIS — K589 Irritable bowel syndrome without diarrhea: Secondary | ICD-10-CM | POA: Diagnosis not present

## 2020-03-30 DIAGNOSIS — I10 Essential (primary) hypertension: Secondary | ICD-10-CM | POA: Diagnosis not present

## 2020-03-30 DIAGNOSIS — Z1331 Encounter for screening for depression: Secondary | ICD-10-CM | POA: Diagnosis not present

## 2020-03-30 DIAGNOSIS — R001 Bradycardia, unspecified: Secondary | ICD-10-CM | POA: Diagnosis not present

## 2020-03-30 DIAGNOSIS — G4730 Sleep apnea, unspecified: Secondary | ICD-10-CM | POA: Diagnosis not present

## 2020-04-06 ENCOUNTER — Other Ambulatory Visit: Payer: Self-pay | Admitting: Family Medicine

## 2020-04-06 ENCOUNTER — Other Ambulatory Visit (HOSPITAL_COMMUNITY): Payer: Self-pay | Admitting: Family Medicine

## 2020-04-06 DIAGNOSIS — M23303 Other meniscus derangements, unspecified medial meniscus, right knee: Secondary | ICD-10-CM

## 2020-04-07 ENCOUNTER — Encounter (INDEPENDENT_AMBULATORY_CARE_PROVIDER_SITE_OTHER): Payer: Self-pay | Admitting: *Deleted

## 2020-04-18 DIAGNOSIS — G4730 Sleep apnea, unspecified: Secondary | ICD-10-CM | POA: Diagnosis not present

## 2020-04-20 ENCOUNTER — Encounter (HOSPITAL_COMMUNITY): Payer: Self-pay

## 2020-04-20 ENCOUNTER — Ambulatory Visit (HOSPITAL_COMMUNITY): Payer: BC Managed Care – PPO

## 2020-05-14 DIAGNOSIS — G4733 Obstructive sleep apnea (adult) (pediatric): Secondary | ICD-10-CM | POA: Diagnosis not present

## 2020-06-23 DIAGNOSIS — I10 Essential (primary) hypertension: Secondary | ICD-10-CM | POA: Diagnosis not present

## 2020-09-02 DIAGNOSIS — G4733 Obstructive sleep apnea (adult) (pediatric): Secondary | ICD-10-CM | POA: Diagnosis not present

## 2020-10-16 DIAGNOSIS — G4733 Obstructive sleep apnea (adult) (pediatric): Secondary | ICD-10-CM | POA: Diagnosis not present

## 2020-11-16 DIAGNOSIS — G4733 Obstructive sleep apnea (adult) (pediatric): Secondary | ICD-10-CM | POA: Diagnosis not present

## 2020-12-17 DIAGNOSIS — G4733 Obstructive sleep apnea (adult) (pediatric): Secondary | ICD-10-CM | POA: Diagnosis not present

## 2020-12-23 DIAGNOSIS — Z6826 Body mass index (BMI) 26.0-26.9, adult: Secondary | ICD-10-CM | POA: Diagnosis not present

## 2020-12-23 DIAGNOSIS — E663 Overweight: Secondary | ICD-10-CM | POA: Diagnosis not present

## 2020-12-23 DIAGNOSIS — G4730 Sleep apnea, unspecified: Secondary | ICD-10-CM | POA: Diagnosis not present

## 2020-12-23 DIAGNOSIS — F17210 Nicotine dependence, cigarettes, uncomplicated: Secondary | ICD-10-CM | POA: Diagnosis not present

## 2021-01-16 DIAGNOSIS — G4733 Obstructive sleep apnea (adult) (pediatric): Secondary | ICD-10-CM | POA: Diagnosis not present

## 2021-02-16 DIAGNOSIS — G4733 Obstructive sleep apnea (adult) (pediatric): Secondary | ICD-10-CM | POA: Diagnosis not present

## 2021-03-18 DIAGNOSIS — G4733 Obstructive sleep apnea (adult) (pediatric): Secondary | ICD-10-CM | POA: Diagnosis not present

## 2021-03-31 DIAGNOSIS — G4730 Sleep apnea, unspecified: Secondary | ICD-10-CM | POA: Diagnosis not present

## 2021-03-31 DIAGNOSIS — I10 Essential (primary) hypertension: Secondary | ICD-10-CM | POA: Diagnosis not present

## 2021-03-31 DIAGNOSIS — Z6825 Body mass index (BMI) 25.0-25.9, adult: Secondary | ICD-10-CM | POA: Diagnosis not present

## 2021-03-31 DIAGNOSIS — Z0000 Encounter for general adult medical examination without abnormal findings: Secondary | ICD-10-CM | POA: Diagnosis not present

## 2021-03-31 DIAGNOSIS — E785 Hyperlipidemia, unspecified: Secondary | ICD-10-CM | POA: Diagnosis not present

## 2021-03-31 DIAGNOSIS — R7309 Other abnormal glucose: Secondary | ICD-10-CM | POA: Diagnosis not present

## 2021-03-31 DIAGNOSIS — K219 Gastro-esophageal reflux disease without esophagitis: Secondary | ICD-10-CM | POA: Diagnosis not present

## 2021-03-31 DIAGNOSIS — E663 Overweight: Secondary | ICD-10-CM | POA: Diagnosis not present

## 2021-03-31 DIAGNOSIS — Z1331 Encounter for screening for depression: Secondary | ICD-10-CM | POA: Diagnosis not present

## 2021-03-31 DIAGNOSIS — K589 Irritable bowel syndrome without diarrhea: Secondary | ICD-10-CM | POA: Diagnosis not present

## 2021-04-07 ENCOUNTER — Encounter (INDEPENDENT_AMBULATORY_CARE_PROVIDER_SITE_OTHER): Payer: Self-pay | Admitting: *Deleted

## 2021-04-18 DIAGNOSIS — G4733 Obstructive sleep apnea (adult) (pediatric): Secondary | ICD-10-CM | POA: Diagnosis not present

## 2021-05-19 DIAGNOSIS — G4733 Obstructive sleep apnea (adult) (pediatric): Secondary | ICD-10-CM | POA: Diagnosis not present

## 2021-05-20 ENCOUNTER — Other Ambulatory Visit: Payer: Self-pay

## 2021-05-20 ENCOUNTER — Ambulatory Visit (INDEPENDENT_AMBULATORY_CARE_PROVIDER_SITE_OTHER): Payer: BC Managed Care – PPO | Admitting: Gastroenterology

## 2021-05-20 ENCOUNTER — Encounter (INDEPENDENT_AMBULATORY_CARE_PROVIDER_SITE_OTHER): Payer: Self-pay | Admitting: Gastroenterology

## 2021-05-20 VITALS — BP 145/84 | HR 87 | Temp 98.7°F | Ht 70.0 in | Wt 185.1 lb

## 2021-05-20 DIAGNOSIS — K219 Gastro-esophageal reflux disease without esophagitis: Secondary | ICD-10-CM

## 2021-05-20 DIAGNOSIS — R1084 Generalized abdominal pain: Secondary | ICD-10-CM | POA: Diagnosis not present

## 2021-05-20 DIAGNOSIS — R140 Abdominal distension (gaseous): Secondary | ICD-10-CM | POA: Diagnosis not present

## 2021-05-20 DIAGNOSIS — Z1211 Encounter for screening for malignant neoplasm of colon: Secondary | ICD-10-CM | POA: Diagnosis not present

## 2021-05-20 MED ORDER — DICYCLOMINE HCL 10 MG PO CAPS: 10.0000 mg | ORAL_CAPSULE | Freq: Three times a day (TID) | ORAL | 1 refills | Status: AC | PRN

## 2021-05-20 NOTE — Patient Instructions (Signed)
It was very nice to meet you We will get you set up for your initial screening colonoscopy, please give [place] a call next week to get this scheduled, once you know your work schedule. I have sent dicyclomine 10mg  for you to take three times a day as needed, you can start off taking this just as needed but if you feel that it is providing good results, you can take it on scheduled dosing, three times a day before meals. This should help with the gurgling/discomfort you are having. As discussed, since you are having frequent heartburn when going a few days without your omeprazole, it may be best to continue this on a regular basis, as over the counter antacids only treat the symptoms, not the cause. You can try doing the omeprazole 2-3x week or every other day, if symptoms are well controlled with this dosing, you can continue with that.  I have also provided low FODMAP diet, you should keep track of foods you are eating and symptoms associated with those for the next 2 weeks, this can be helpful to pinpoint if certain things seem to trigger your bloating/discomfort.  I also would recommend trying IBgard, this is over the counter, and can help with bloating and discomfort as well.  Follow up 6 months

## 2021-05-20 NOTE — Progress Notes (Signed)
Referring Provider: Sharilyn Sites, MD Primary Care Physician:  Sharilyn Sites, MD Primary GI Physician: newly established  Chief Complaint  Patient presents with   Abdominal Pain    Patient here today with complaints of Abdominal pain and bloating. He has some gurgling sounds coming from abdomin. He states he has two bm's per day. Stools are formed. No dark or bloody stools seen.Feels as if he has incomplete bowel movements.Takes Omeprazole 40 mg twice a week. He states gas x is of no help.   HPI:   Terry Moses is a 51 y.o. male with past medical history of GERD, Heart murmur, high cholesterol, HTN, Sleep apnea.   Patient presenting today for abdominal discomfort and bloating, and to set up initial screening colonoscopy.  Patient states that he has gurgling in his stomach, this occurs everyday, he states that everytime he eats he notices this and also feels bloated, thereafter. States symptoms have been ongoing for the past 2 years. He denies any abdominal pain but just that his stomach feels unsettled. He denies diarrhea. States that he had to strain some about 1 month ago when having a BM and developed some hemorrhoids which caused some bright red bleeding when wiping at that time, he reports he could feel the hemorrhoids when he reached back to wipe. Had itching and burning, used preparation H with some relief. Has not had any rectal bleeding since them. Has 2 BMs per day, typically at the same time every day. Denies blood in stool or melena. No unintentional weight loss or changes in appetite. States that sometimes having a BM helps his gurgling/discomfort feeling. He also reports that passing gas helps resolve it some. Has taken gas x which does not really help his bloated feeling. Does not think that symptoms are related to certain things he eats, though he does Feels that fried foods tend to make it somewhat worse.    States he went to PCP in December and was talking to him about the  stomach gurgling, he was advised to wean down off of his PPI to see if this helped, he states that he is now only taking omeprazole twice a week, though if he goes more than a few days without taking it, his reflux acts up.   NSAID use: ibuprofen 600mg  1-2x/day, used to take up to 16 ibuprofen per day for chronic headaches due to deviated septum  Social hx:1 PPD, 2-3 beers 2-3 days per week Fam hx: no crc, liver disease or IBD  Last Colonoscopy:never Last Endoscopy:3/26/14No evidence of erosive esophagitis. Nonerosive antral gastritis no evidence of peptic ulcer disease.  Past Medical History:  Diagnosis Date   Anxiety    GERD (gastroesophageal reflux disease)    Headache(784.0)    Heart murmur    High cholesterol    High cholesterol    Hypertension    Sleep apnea     Past Surgical History:  Procedure Laterality Date   ESOPHAGOGASTRODUODENOSCOPY N/A 07/11/2012   Procedure: ESOPHAGOGASTRODUODENOSCOPY (EGD);  Surgeon: Rogene Houston, MD;  Location: [facility];  Service: Endoscopy;  Laterality: N/A;  150   Varicele      Current Outpatient Medications  Medication Sig Dispense Refill   ibuprofen (ADVIL,MOTRIN) 200 MG tablet Take 200 mg by mouth every 6 (six) hours as needed for pain.     Multiple Vitamin (MULTIVITAMIN) capsule Take 1 capsule by mouth daily.     omeprazole (PRILOSEC) 40 MG capsule Take 40 mg by mouth daily.  rosuvastatin (CRESTOR) 20 MG tablet Take 20 mg by mouth daily.     valsartan-hydrochlorothiazide (DIOVAN-HCT) 320-25 MG per tablet Take 1 tablet by mouth daily.     No current facility-administered medications for this visit.    Allergies as of 05/20/2021 - Review Complete 05/20/2021  Allergen Reaction Noted   Penicillins Anaphylaxis 06/28/2012   Family History  Problem Relation Age of Onset   Hyperlipidemia Mother    Coronary artery disease Father    COPD Father    Social History   Socioeconomic History   Marital status: Married    Spouse  name: Not on file   Number of children: Not on file   Years of education: Not on file   Highest education level: Not on file  Occupational History   Not on file  Tobacco Use   Smoking status: Every Day    Packs/day: 1.00    Years: 25.00    Pack years: 25.00    Types: Cigarettes   Smokeless tobacco: Former    Quit date: 08/22/2011  Vaping Use   Vaping Use: Never used  Substance and Sexual Activity   Alcohol use: Yes    Comment: 5-6 cans a week   Drug use: No   Sexual activity: Yes  Other Topics Concern   Not on file  Social History Narrative   Not on file   Social Determinants of Health   Financial Resource Strain: Not on file  Food Insecurity: Not on file  Transportation Needs: Not on file  Physical Activity: Not on file  Stress: Not on file  Social Connections: Not on file    Review of systems General: negative for malaise, night sweats, fever, chills, weight los Neck: Negative for lumps, goiter, pain and significant neck swelling Resp: Negative for cough, wheezing, dyspnea at rest CV: Negative for chest pain, leg swelling, palpitations, orthopnea GI: denies melena, hematochezia, nausea, vomiting, diarrhea, constipation, dysphagia, odyonophagia, early satiety or unintentional weight loss. +abdominal bloating +abdominal gurgling MSK: Negative for joint pain or swelling, back pain, and muscle pain. Derm: Negative for itching or rash Psych: Denies depression, anxiety, memory loss, confusion. No homicidal or suicidal ideation.  Heme: Negative for prolonged bleeding, bruising easily, and swollen nodes. Endocrine: Negative for cold or heat intolerance, polyuria, polydipsia and goiter. Neuro: negative for tremor, gait imbalance, syncope and seizures. The remainder of the review of systems is noncontributory.  Physical Exam: BP (!) 145/84 (BP Location: Left Arm, Patient Position: Sitting, Cuff Size: Large)    Pulse 87    Temp 98.7 F (37.1 C) (Oral)    Ht 5\' 10"  (1.778 m)     Wt 185 lb 1.6 oz (84 kg)    BMI 26.56 kg/m  General:   Alert and oriented. No distress noted. Pleasant and cooperative.  Head:  Normocephalic and atraumatic. Eyes:  Conjuctiva clear without scleral icterus. Mouth:  Oral mucosa pink and moist. Good dentition. No lesions. Heart: Normal rate and rhythm, s1 and s2 heart sounds present.  Lungs: Clear lung sounds in all lobes. Respirations equal and unlabored. Abdomen:  +BS, soft, non-tender and non-distended. No rebound or guarding. No HSM or masses noted. Derm: No palmar erythema or jaundice Msk:  Symmetrical without gross deformities. Normal posture. Extremities:  Without edema. Neurologic:  Alert and  oriented x4 Psych:  Alert and cooperative. Normal mood and affect.  Invalid input(s): 6 MONTHS   ASSESSMENT: ISMAEL KARGE is a 51 y.o. male presenting today for abdominal discomfort/gurgling/bloating and to set  up initial screening colonoscopy.  Abdominal discomfort/gurgling/bloating present x2 years, without alarm symptoms. Typically happens after eating and sometimes is relieved by having a BM. This is likely related to IBS. We will try implementing low FODMAP diet and Bentyl 10mg  TID PRN to see if this helps to settle things down some. Patient is without constipation or diarrhea. He also reports bloating that is not relieved by gas-x, I discussed trying otc IBgard and keeping a food diary to see if symptoms can be correlated more with certain trigger foods.   We will also get patient scheduled for initial CRC screening via colonoscopy as he has never had one. He has no family history of colon cancer or polyps that he is aware of.   PCP had advised him a while back to wean off of his PPI in regards to the stomach gurgling/discomfort he was having, thinking this may have been playing a role in this, however, gurgling/discomfort did not improve with decreasing PPI. He states that he does well taking it 2-3 times per week, but if he goes more  than a few days in between PPI doses, he notices a lot of heartburn. I recommended that he take it every other day or every 2 days to ensure that GERD symptoms are well controlled. I also discussed the importance of keeping acid reflux managed as ongoing GERD that is uncontrolled can lead to reflux esophagitis even Barrett's esophagus. Patient verbalized understanding.   Patient also counseled on heavy NSAID use and complications that can occur in the GI tract such as inflammation, bleeding, PUD. Recommended to use tylenol or alternate ibuprofen and tylenol to avoid over use of NSAIDs. Patient verbalized understanding.  PLAN:  Schedule screening colonoscopy, endo rm 1 2. Low FODMAP diet and keep food diary 3. Bentyl 10mg  TID PRN 4. IBgard as needed for bloating 5. Continue omeprazole 40mg , can do 2-3x/week if well controlled, or every other day  6. Decrease NSAID use  Follow Up: 6 months  Kurt Hoffmeier L. Alver Sorrow, MSN, APRN, AGNP-C Adult-Gerontology Nurse Practitioner [facility]

## 2021-05-23 DIAGNOSIS — R1084 Generalized abdominal pain: Secondary | ICD-10-CM | POA: Insufficient documentation

## 2021-05-23 DIAGNOSIS — R140 Abdominal distension (gaseous): Secondary | ICD-10-CM | POA: Insufficient documentation

## 2021-05-24 ENCOUNTER — Telehealth (INDEPENDENT_AMBULATORY_CARE_PROVIDER_SITE_OTHER): Payer: Self-pay | Admitting: *Deleted

## 2021-05-24 NOTE — Telephone Encounter (Signed)
Patient left message, he saw Vikki Ports last week and he wants to go ahead and schedule TCS - please call  Phone# [phone]

## 2021-05-25 ENCOUNTER — Other Ambulatory Visit (INDEPENDENT_AMBULATORY_CARE_PROVIDER_SITE_OTHER): Payer: Self-pay

## 2021-05-25 ENCOUNTER — Telehealth (INDEPENDENT_AMBULATORY_CARE_PROVIDER_SITE_OTHER): Payer: Self-pay

## 2021-05-25 ENCOUNTER — Encounter (INDEPENDENT_AMBULATORY_CARE_PROVIDER_SITE_OTHER): Payer: Self-pay

## 2021-05-25 DIAGNOSIS — Z1211 Encounter for screening for malignant neoplasm of colon: Secondary | ICD-10-CM

## 2021-05-25 MED ORDER — PEG 3350-KCL-NA BICARB-NACL 420 G PO SOLR: 4000.0000 mL | ORAL | 0 refills | Status: DC

## 2021-05-25 NOTE — Telephone Encounter (Signed)
Audrie Kuri Ann Georgiana Spillane, CMA  ?

## 2021-05-26 ENCOUNTER — Encounter (INDEPENDENT_AMBULATORY_CARE_PROVIDER_SITE_OTHER): Payer: Self-pay

## 2021-06-02 DIAGNOSIS — E663 Overweight: Secondary | ICD-10-CM | POA: Diagnosis not present

## 2021-06-02 DIAGNOSIS — Z1331 Encounter for screening for depression: Secondary | ICD-10-CM | POA: Diagnosis not present

## 2021-06-02 DIAGNOSIS — Z6825 Body mass index (BMI) 25.0-25.9, adult: Secondary | ICD-10-CM | POA: Diagnosis not present

## 2021-06-02 DIAGNOSIS — R42 Dizziness and giddiness: Secondary | ICD-10-CM | POA: Diagnosis not present

## 2021-06-02 DIAGNOSIS — H6123 Impacted cerumen, bilateral: Secondary | ICD-10-CM | POA: Diagnosis not present

## 2021-06-09 ENCOUNTER — Other Ambulatory Visit (HOSPITAL_COMMUNITY)
Admission: RE | Admit: 2021-06-09 | Discharge: 2021-06-09 | Disposition: A | Discharge status: Home / Self Care | Payer: BC Managed Care – PPO | Source: Ambulatory Visit | Attending: Internal Medicine | Admitting: Internal Medicine

## 2021-06-09 DIAGNOSIS — Z1211 Encounter for screening for malignant neoplasm of colon: Secondary | ICD-10-CM | POA: Diagnosis not present

## 2021-06-09 LAB — BASIC METABOLIC PANEL
Anion gap: 7 (ref 5–15)
BUN: 19 mg/dL (ref 6–20)
CO2: 30 mmol/L (ref 22–32)
Calcium: 9.6 mg/dL (ref 8.9–10.3)
Chloride: 101 mmol/L (ref 98–111)
Creatinine, Ser: 1.03 mg/dL (ref 0.61–1.24)
GFR, Estimated: >60 mL/min (ref >60)
Glucose, Bld: 105 mg/dL — ABNORMAL HIGH (ref 70–99)
Potassium: 4.2 mmol/L (ref 3.5–5.1)
Sodium: 138 mmol/L (ref 135–145)

## 2021-06-11 NOTE — Patient Instructions (Signed)
Terry Moses  06/11/2021     @PREFPERIOPPHARMACY @   Your procedure is scheduled on  06/16/2021.   Report to [facility] at  1020  A.M.   Call this number if you have problems the morning of surgery:  [phone]   Remember:  Follow the diet and prep instructions given to you by the office.    Take these medicines the morning of surgery with A SIP OF WATER                           antivert(if needed), prilosec.     Do not wear jewelry, make-up or nail polish.  Do not wear lotions, powders, or perfumes, or deodorant.  Do not shave 48 hours prior to surgery.  Men may shave face and neck.  Do not bring valuables to the hospital.  [facility] is not responsible for any belongings or valuables.  Contacts, dentures or bridgework may not be worn into surgery.  Leave your suitcase in the car.  After surgery it may be brought to your room.  For patients admitted to the hospital, discharge time will be determined by your treatment team.  Patients discharged the day of surgery will not be allowed to drive home and must have someone with them for 24 hours.    Special instructions:   DO NOT smoke tobacco or vape for 24 hours before your procedure.  Please read over the following fact sheets that you were given. Anesthesia Post-op Instructions and Care and Recovery After Surgery      Colonoscopy, Adult, Care After This sheet gives you information about how to care for yourself after your procedure. Your health care provider may also give you more specific instructions. If you have problems or questions, contact your health care provider. What can I expect after the procedure? After the procedure, it is common to have: A small amount of blood in your stool for 24 hours after the procedure. Some gas. Mild cramping or bloating of your abdomen. Follow these instructions at home: Eating and drinking  Drink enough fluid to keep your urine pale yellow. Follow instructions  from your health care provider about eating or drinking restrictions. Resume your normal diet as instructed by your health care provider. Avoid heavy or fried foods that are hard to digest. Activity Rest as told by your health care provider. Avoid sitting for a long time without moving. Get up to take short walks every 1-2 hours. This is important to improve blood flow and breathing. Ask for help if you feel weak or unsteady. Return to your normal activities as told by your health care provider. Ask your health care provider what activities are safe for you. Managing cramping and bloating  Try walking around when you have cramps or feel bloated. Apply heat to your abdomen as told by your health care provider. Use the heat source that your health care provider recommends, such as a moist heat pack or a heating pad. Place a towel between your skin and the heat source. Leave the heat on for 20-30 minutes. Remove the heat if your skin turns bright red. This is especially important if you are unable to feel pain, heat, or cold. You may have a greater risk of getting burned. General instructions If you were given a sedative during the procedure, it can affect you for several hours. Do not drive or operate machinery until your health  care provider says that it is safe. For the first 24 hours after the procedure: Do not sign important documents. Do not drink alcohol. Do your regular daily activities at a slower pace than normal. Eat soft foods that are easy to digest. Take over-the-counter and prescription medicines only as told by your health care provider. Keep all follow-up visits as told by your health care provider. This is important. Contact a health care provider if: You have blood in your stool 2-3 days after the procedure. Get help right away if you have: More than a small spotting of blood in your stool. Large blood clots in your stool. Swelling of your abdomen. Nausea or vomiting. A  fever. Increasing pain in your abdomen that is not relieved with medicine. Summary After the procedure, it is common to have a small amount of blood in your stool. You may also have mild cramping and bloating of your abdomen. If you were given a sedative during the procedure, it can affect you for several hours. Do not drive or operate machinery until your health care provider says that it is safe. Get help right away if you have a lot of blood in your stool, nausea or vomiting, a fever, or increased pain in your abdomen. This information is not intended to replace advice given to you by your health care provider. Make sure you discuss any questions you have with your health care provider. Document Revised: 02/08/2019 Document Reviewed: 10/29/2018 Elsevier Patient Education  [place] After This sheet gives you information about how to care for yourself after your procedure. Your health care provider may also give you more specific instructions. If you have problems or questions, contact your health care provider. What can I expect after the procedure? After the procedure, it is common to have: Tiredness. Forgetfulness about what happened after the procedure. Impaired judgment for important decisions. Nausea or vomiting. Some difficulty with balance. Follow these instructions at home: For the time period you were told by your health care provider:   Rest as needed. Do not participate in activities where you could fall or become injured. Do not drive or use machinery. Do not drink alcohol. Do not take sleeping pills or medicines that cause drowsiness. Do not make important decisions or sign legal documents. Do not take care of children on your own. Eating and drinking Follow the diet that is recommended by your health care provider. Drink enough fluid to keep your urine pale yellow. If you vomit: Drink water, juice, or soup when you can drink  without vomiting. Make sure you have little or no nausea before eating solid foods. General instructions Have a responsible adult stay with you for the time you are told. It is important to have someone help care for you until you are awake and alert. Take over-the-counter and prescription medicines only as told by your health care provider. If you have sleep apnea, surgery and certain medicines can increase your risk for breathing problems. Follow instructions from your health care provider about wearing your sleep device: Anytime you are sleeping, including during daytime naps. While taking prescription pain medicines, sleeping medicines, or medicines that make you drowsy. Avoid smoking. Keep all follow-up visits as told by your health care provider. This is important. Contact a health care provider if: You keep feeling nauseous or you keep vomiting. You feel light-headed. You are still sleepy or having trouble with balance after 24 hours. You develop a rash. You have  a fever. You have redness or swelling around the IV site. Get help right away if: You have trouble breathing. You have new-onset confusion at home. Summary For several hours after your procedure, you may feel tired. You may also be forgetful and have poor judgment. Have a responsible adult stay with you for the time you are told. It is important to have someone help care for you until you are awake and alert. Rest as told. Do not drive or operate machinery. Do not drink alcohol or take sleeping pills. Get help right away if you have trouble breathing, or if you suddenly become confused. This information is not intended to replace advice given to you by your health care provider. Make sure you discuss any questions you have with your health care provider. Document Revised: 12/19/2019 Document Reviewed: 03/07/2019 Elsevier Patient Education  2022 [employer].

## 2021-06-14 ENCOUNTER — Encounter (HOSPITAL_COMMUNITY): Payer: Self-pay

## 2021-06-14 ENCOUNTER — Encounter (HOSPITAL_COMMUNITY)
Admission: RE | Admit: 2021-06-14 | Discharge: 2021-06-14 | Disposition: A | Discharge status: Home / Self Care | Payer: BC Managed Care – PPO | Source: Ambulatory Visit | Attending: Internal Medicine | Admitting: Internal Medicine

## 2021-06-16 ENCOUNTER — Ambulatory Visit (HOSPITAL_COMMUNITY): Payer: BC Managed Care – PPO | Admitting: Anesthesiology

## 2021-06-16 ENCOUNTER — Ambulatory Visit (HOSPITAL_COMMUNITY)
Admission: RE | Admit: 2021-06-16 | Discharge: 2021-06-16 | Disposition: A | Discharge status: Home / Self Care | Payer: BC Managed Care – PPO | Attending: Internal Medicine | Admitting: Internal Medicine

## 2021-06-16 ENCOUNTER — Encounter (HOSPITAL_COMMUNITY)
Admission: RE | Disposition: A | Discharge status: Home / Self Care | Payer: Self-pay | Source: Home / Self Care | Attending: Internal Medicine

## 2021-06-16 ENCOUNTER — Encounter (INDEPENDENT_AMBULATORY_CARE_PROVIDER_SITE_OTHER): Payer: Self-pay | Admitting: *Deleted

## 2021-06-16 DIAGNOSIS — R519 Headache, unspecified: Secondary | ICD-10-CM | POA: Insufficient documentation

## 2021-06-16 DIAGNOSIS — I10 Essential (primary) hypertension: Secondary | ICD-10-CM | POA: Diagnosis not present

## 2021-06-16 DIAGNOSIS — K6289 Other specified diseases of anus and rectum: Secondary | ICD-10-CM | POA: Diagnosis not present

## 2021-06-16 DIAGNOSIS — Z79899 Other long term (current) drug therapy: Secondary | ICD-10-CM | POA: Insufficient documentation

## 2021-06-16 DIAGNOSIS — D123 Benign neoplasm of transverse colon: Secondary | ICD-10-CM | POA: Diagnosis not present

## 2021-06-16 DIAGNOSIS — F17210 Nicotine dependence, cigarettes, uncomplicated: Secondary | ICD-10-CM | POA: Diagnosis not present

## 2021-06-16 DIAGNOSIS — K219 Gastro-esophageal reflux disease without esophagitis: Secondary | ICD-10-CM | POA: Insufficient documentation

## 2021-06-16 DIAGNOSIS — Z1211 Encounter for screening for malignant neoplasm of colon: Secondary | ICD-10-CM | POA: Diagnosis not present

## 2021-06-16 DIAGNOSIS — K644 Residual hemorrhoidal skin tags: Secondary | ICD-10-CM | POA: Insufficient documentation

## 2021-06-16 DIAGNOSIS — K648 Other hemorrhoids: Secondary | ICD-10-CM | POA: Diagnosis not present

## 2021-06-16 DIAGNOSIS — D128 Benign neoplasm of rectum: Secondary | ICD-10-CM | POA: Diagnosis not present

## 2021-06-16 DIAGNOSIS — Z791 Long term (current) use of non-steroidal anti-inflammatories (NSAID): Secondary | ICD-10-CM | POA: Insufficient documentation

## 2021-06-16 DIAGNOSIS — K589 Irritable bowel syndrome without diarrhea: Secondary | ICD-10-CM | POA: Insufficient documentation

## 2021-06-16 DIAGNOSIS — G4730 Sleep apnea, unspecified: Secondary | ICD-10-CM | POA: Diagnosis not present

## 2021-06-16 DIAGNOSIS — G4733 Obstructive sleep apnea (adult) (pediatric): Secondary | ICD-10-CM | POA: Diagnosis not present

## 2021-06-16 DIAGNOSIS — K635 Polyp of colon: Secondary | ICD-10-CM | POA: Diagnosis not present

## 2021-06-16 HISTORY — PX: POLYPECTOMY: SHX149

## 2021-06-16 HISTORY — PX: COLONOSCOPY WITH PROPOFOL: SHX5780

## 2021-06-16 LAB — HM COLONOSCOPY

## 2021-06-16 SURGERY — COLONOSCOPY WITH PROPOFOL
Anesthesia: General

## 2021-06-16 MED ORDER — LACTATED RINGERS IV SOLN: INTRAVENOUS | Status: DC

## 2021-06-16 MED ORDER — MIDAZOLAM HCL 2 MG/2ML IJ SOLN: 2.0000 mg | Freq: Once | INTRAMUSCULAR | Status: AC

## 2021-06-16 MED ORDER — MIDAZOLAM HCL 2 MG/2ML IJ SOLN
INTRAMUSCULAR | Status: AC
  Administered 2021-06-16: 2 mg via INTRAVENOUS
  Filled 2021-06-16: qty 2

## 2021-06-16 MED ORDER — LIDOCAINE HCL (CARDIAC) PF 50 MG/5ML IV SOSY
PREFILLED_SYRINGE | INTRAVENOUS | Status: DC | PRN
  Administered 2021-06-16: 50 mg via INTRAVENOUS

## 2021-06-16 MED ORDER — PROPOFOL 10 MG/ML IV BOLUS
INTRAVENOUS | Status: DC | PRN
  Administered 2021-06-16 (×3): 50 mg via INTRAVENOUS
  Administered 2021-06-16: 80 mg via INTRAVENOUS
  Administered 2021-06-16: 120 mg via INTRAVENOUS

## 2021-06-16 NOTE — H&P (Signed)
Terry Moses is an 51 y.o. male.   ?Chief Complaint: Patient is here for colonoscopy. ?HPI: Patient is a 51 year old Caucasian male who is here for screening colonoscopy.  He denies rectal bleeding.  He has history of IBS.  He was seen in the office last month and begun on dicyclomine which has helped with cramping.  He is using it on as-needed basis.  He has a good appetite.  Family history is negative for colon cancer.  His mother has had 1 or 2 polyps removed. ?He takes ibuprofen anywhere from 400 to 1200 mg daily but no more than 2 or 3 at a time.  He takes it for headache.  He does not take aspirin. ? ?Past Medical History:  ?Diagnosis Date  ? Anxiety   ? GERD (gastroesophageal reflux disease)   ? Headache(784.0)   ? Heart murmur   ? High cholesterol   ? High cholesterol   ? Hypertension   ? Sleep apnea   ? ? ?Past Surgical History:  ?Procedure Laterality Date  ? ESOPHAGOGASTRODUODENOSCOPY N/A 07/11/2012  ? Procedure: ESOPHAGOGASTRODUODENOSCOPY (EGD);  Surgeon: Rogene Houston, MD;  Location: [facility];  Service: Endoscopy;  Laterality: N/A;  150  ? Varicele    ? WISDOM TOOTH EXTRACTION    ? ? ?Family History  ?Problem Relation Age of Onset  ? Hyperlipidemia Mother   ? Coronary artery disease Father   ? COPD Father   ? ?Social History:  reports that he has been smoking cigarettes. He has a 25.00 pack-year smoking history. He quit smokeless tobacco use about 9 years ago. He reports current alcohol use. He reports that he does not use drugs. ? ?Allergies:  ?Allergies  ?Allergen Reactions  ? Penicillins Anaphylaxis  ?  Childhood allergy.  ? ? ?Medications Prior to Admission  ?Medication Sig Dispense Refill  ? acetaminophen (TYLENOL) 500 MG tablet Take 1,000 mg by mouth every 6 (six) hours as needed for moderate pain.    ? diclofenac Sodium (VOLTAREN) 1 % GEL Apply 1 application topically 4 (four) times daily as needed (pain).    ? dicyclomine (BENTYL) 10 MG capsule Take 1 capsule (10 mg total) by mouth 3  (three) times daily with meals as needed for spasms. 90 capsule 1  ? ibuprofen (ADVIL,MOTRIN) 200 MG tablet Take 600 mg by mouth every 6 (six) hours as needed for moderate pain.    ? levocetirizine (XYZAL) 5 MG tablet Take 5 mg by mouth daily as needed for allergies.    ? meclizine (ANTIVERT) 25 MG tablet Take 25 mg by mouth every 6 (six) hours as needed for dizziness.    ? Multiple Vitamin (MULTIVITAMIN) capsule Take 1 capsule by mouth daily.    ? neomycin-polymyxin-hydrocortisone (CORTISPORIN) 3.5-10000-1 OTIC suspension Place 4 drops into both ears at bedtime.    ? omeprazole (PRILOSEC) 40 MG capsule Take 40 mg by mouth daily.    ? polyethylene glycol-electrolytes (TRILYTE) 420 g solution Take 4,000 mLs by mouth as directed. 4000 mL 0  ? rosuvastatin (CRESTOR) 20 MG tablet Take 20 mg by mouth daily.    ? Simethicone (GAS-X PO) Take 2 tablets by mouth daily as needed (gas).    ? sodium chloride (OCEAN) 0.65 % SOLN nasal spray Place 1 spray into both nostrils as needed for congestion.    ? valsartan (DIOVAN) 160 MG tablet Take 160 mg by mouth daily.    ? ? ?No results found for this or any previous visit (from the  past 48 hour(s)). ?No results found. ? ?Review of Systems ? ?Blood pressure 138/90, pulse 80, temperature 99.2 ?F (37.3 ?C), temperature source Oral, resp. rate 18, SpO2 98 %. ?Physical Exam ?HENT:  ?   Mouth/Throat:  ?   Mouth: Mucous membranes are moist.  ?   Pharynx: Oropharynx is clear.  ?Eyes:  ?   General: No scleral icterus. ?   Conjunctiva/sclera: Conjunctivae normal.  ?Cardiovascular:  ?   Rate and Rhythm: Normal rate and regular rhythm.  ?   Heart sounds: Normal heart sounds. No murmur heard. ?Pulmonary:  ?   Effort: Pulmonary effort is normal.  ?   Breath sounds: Normal breath sounds.  ?Abdominal:  ?   General: There is no distension.  ?   Palpations: Abdomen is soft. There is no mass.  ?   Tenderness: There is no abdominal tenderness.  ?Musculoskeletal:     ?   General: No swelling.  ?    Cervical back: Neck supple.  ?Skin: ?   General: Skin is warm and dry.  ?Neurological:  ?   Mental Status: He is alert.  ?  ? ?Assessment/Plan ? ?Average risk screening colonoscopy ? ?Hildred Laser, MD ?06/16/2021, 10:51 AM ? ? ? ?

## 2021-06-16 NOTE — Anesthesia Postprocedure Evaluation (Signed)
Anesthesia Post Note ? ?Patient: TREMONT GAVITT ? ?Procedure(s) Performed: COLONOSCOPY WITH PROPOFOL ?POLYPECTOMY INTESTINAL ? ?Patient location during evaluation: Phase II ?Anesthesia Type: General ?Level of consciousness: awake and alert and oriented ?Pain management: pain level controlled ?Vital Signs Assessment: post-procedure vital signs reviewed and stable ?Respiratory status: spontaneous breathing, nonlabored ventilation and respiratory function stable ?Cardiovascular status: blood pressure returned to baseline and stable ?Postop Assessment: no apparent nausea or vomiting ?Anesthetic complications: no ? ? ?No notable events documented. ? ? ?Last Vitals:  ?Vitals:  ? 06/16/21 1021 06/16/21 1119  ?BP: 138/90 100/78  ?Pulse: 80 76  ?Resp: 18 17  ?Temp: 37.3 ?C 36.7 ?C  ?SpO2: 98% 100%  ?  ?Last Pain:  ?Vitals:  ? 06/16/21 1119  ?TempSrc: Oral  ?PainSc:   ? ? ?  ?  ?  ?  ?  ?  ? ?Yousef Huge C Arra Connaughton ? ? ? ? ?

## 2021-06-16 NOTE — Discharge Instructions (Signed)
No aspirin or NSAIDs for 24 hours. ?Resume other medications and diet as before. ?No driving for 24 hours. ?Physician will call with biopsy results. ?

## 2021-06-16 NOTE — Progress Notes (Signed)
error 

## 2021-06-16 NOTE — Anesthesia Procedure Notes (Signed)
Date/Time: 06/16/2021 10:55 AM ?Performed by: Vista Deck, CRNA ?Pre-anesthesia Checklist: Patient identified, Emergency Drugs available, Suction available, Timeout performed and Patient being monitored ?Patient Re-evaluated:Patient Re-evaluated prior to induction ?Oxygen Delivery Method: Nasal Cannula ? ? ? ? ?

## 2021-06-16 NOTE — Anesthesia Preprocedure Evaluation (Signed)
Anesthesia Evaluation  ?Patient identified by MRN, date of birth, ID band ?Patient awake ? ? ? ?Reviewed: ?Allergy & Precautions, NPO status , Patient's Chart, lab work & pertinent test results ? ?Airway ?Mallampati: III ? ?TM Distance: >3 FB ?Neck ROM: Full ? ? ? Dental ? ?(+) Dental Advisory Given, Chipped ?  ?Pulmonary ?sleep apnea , Current Smoker and Patient abstained from smoking.,  ?  ?Pulmonary exam normal ?breath sounds clear to auscultation ? ? ? ? ? ? Cardiovascular ?hypertension, Pt. on medications ?Normal cardiovascular exam+ Valvular Problems/Murmurs  ?Rhythm:Regular Rate:Normal ? ? ?  ?Neuro/Psych ? Headaches, PSYCHIATRIC DISORDERS Anxiety   ? GI/Hepatic ?Neg liver ROS, GERD  Medicated and Controlled,  ?Endo/Other  ?negative endocrine ROS ? Renal/GU ?negative Renal ROS  ?negative genitourinary ?  ?Musculoskeletal ?negative musculoskeletal ROS ?(+)  ? Abdominal ?  ?Peds ?negative pediatric ROS ?(+)  Hematology ?negative hematology ROS ?(+)   ?Anesthesia Other Findings ? ? Reproductive/Obstetrics ?negative OB ROS ? ?  ? ? ? ? ? ? ? ? ? ? ? ? ? ?  ?  ? ? ? ? ? ? ? ? ?Anesthesia Physical ?Anesthesia Plan ? ?ASA: 2 ? ?Anesthesia Plan: General  ? ?Post-op Pain Management: Minimal or no pain anticipated  ? ?Induction: Intravenous ? ?PONV Risk Score and Plan: TIVA ? ?Airway Management Planned: Nasal Cannula and Natural Airway ? ?Additional Equipment:  ? ?Intra-op Plan:  ? ?Post-operative Plan:  ? ?Informed Consent: I have reviewed the patients History and Physical, chart, labs and discussed the procedure including the risks, benefits and alternatives for the proposed anesthesia with the patient or authorized representative who has indicated his/her understanding and acceptance.  ? ? ? ?Dental advisory given ? ?Plan Discussed with: CRNA and Surgeon ? ?Anesthesia Plan Comments:   ? ? ? ? ? ? ?Anesthesia Quick Evaluation ? ?

## 2021-06-16 NOTE — Op Note (Signed)
[facility] ?Patient Name: Terry Moses ?Procedure Date: 06/16/2021 10:37 AM ?MRN: [id number] ?Date of Birth: October 22, 1970 ?Attending MD: Hildred Laser , MD ?CSN: [id number] ?Age: 51 ?Admit Type: Outpatient ?Procedure:                Colonoscopy ?Indications:              Screening for colorectal malignant neoplasm ?Providers:                Hildred Laser, MD, Powell Page, Wynonia Musty  ?                          Tech, Technician ?Referring MD:             Chauncey Mann, MD ?Medicines:                Propofol per Anesthesia ?Complications:            No immediate complications. ?Estimated Blood Loss:     Estimated blood loss was minimal. ?Procedure:                Pre-Anesthesia Assessment: ?                          - Prior to the procedure, a History and Physical  ?                          was performed, and patient medications and  ?                          allergies were reviewed. The patient's tolerance of  ?                          previous anesthesia was also reviewed. The risks  ?                          and benefits of the procedure and the sedation  ?                          options and risks were discussed with the patient.  ?                          All questions were answered, and informed consent  ?                          was obtained. Prior Anticoagulants: The patient has  ?                          taken no previous anticoagulant or antiplatelet  ?                          agents except for NSAID medication. ASA Grade  ?                          Assessment: II - A patient with mild systemic  ?  disease. After reviewing the risks and benefits,  ?                          the patient was deemed in satisfactory condition to  ?                          undergo the procedure. ?                          After obtaining informed consent, the colonoscope  ?                          was passed under direct vision. Throughout the  ?                          procedure, the  patient's blood pressure, pulse, and  ?                          oxygen saturations were monitored continuously. The  ?                          PCF-HQ190L ([id number]) scope was introduced through  ?                          the anus and advanced to the the cecum, identified  ?                          by appendiceal orifice and ileocecal valve. The  ?                          colonoscopy was performed without difficulty. The  ?                          patient tolerated the procedure well. The quality  ?                          of the bowel preparation was excellent. The  ?                          ileocecal valve, appendiceal orifice, and rectum  ?                          were photographed. ?Scope In: 10:58:52 AM ?Scope Out: 11:13:58 AM ?Scope Withdrawal Time: 0 hours 9 minutes 40 seconds  ?Total Procedure Duration: 0 hours 15 minutes 6 seconds  ?Findings: ?     The perianal and digital rectal examinations were normal. ?     Two polyps were found in the rectum and transverse colon. The polyps  ?     were small in size. These polyps were removed with a cold snare.  ?     Resection and retrieval were complete. The pathology specimen was placed  ?     into Bottle Number 1. ?     External hemorrhoids were found during retroflexion. The hemorrhoids  ?     were small. ?  Anal papilla(e) were hypertrophied. ?Impression:               - Two small polyps in the rectum and in the  ?                          transverse colon, removed with a cold snare.  ?                          Resected and retrieved. ?                          - External hemorrhoids. ?                          - Anal papilla(e) were hypertrophied. ?Moderate Sedation: ?     Per Anesthesia Care ?Recommendation:           - Patient has a contact number available for  ?                          emergencies. The signs and symptoms of potential  ?                          delayed complications were discussed with the  ?                          patient.  Return to normal activities tomorrow.  ?                          Written discharge instructions were provided to the  ?                          patient. ?                          - Resume previous diet today. ?                          - Continue present medications. ?                          - No aspirin, ibuprofen, naproxen, or other  ?                          non-steroidal anti-inflammatory drugs for 1 day. ?                          - Await pathology results. ?                          - Repeat colonoscopy is recommended. The  ?                          colonoscopy date will be determined after pathology  ?                          results from today's exam become  available for  ?                          review. ?Procedure Code(s):        --- Professional --- ?                          [phone], Colonoscopy, flexible; with removal of  ?                          tumor(s), polyp(s), or other lesion(s) by snare  ?                          technique ?Diagnosis Code(s):        --- Professional --- ?                          Z12.11, Encounter for screening for malignant  ?                          neoplasm of colon ?                          K62.1, Rectal polyp ?                          K63.5, Polyp of colon ?                          K62.89, Other specified diseases of anus and rectum ?                          K64.4, Residual hemorrhoidal skin tags ?CPT copyright 2019 [employer]. All rights reserved. ?The codes documented in this report are preliminary and upon coder review may  ?be revised to meet current compliance requirements. ?Hildred Laser, MD ?Hildred Laser, MD ?06/16/2021 11:22:49 AM ?This report has been signed electronically. ?Number of Addenda: 0 ?

## 2021-06-16 NOTE — Transfer of Care (Signed)
Immediate Anesthesia Transfer of Care Note ? ?Patient: Terry Moses ? ?Procedure(s) Performed: COLONOSCOPY WITH PROPOFOL ?POLYPECTOMY INTESTINAL ? ?Patient Location: Short Stay ? ?Anesthesia Type:General ? ?Level of Consciousness: awake and patient cooperative ? ?Airway & Oxygen Therapy: Patient Spontanous Breathing ? ?Post-op Assessment: Report given to RN and Post -op Vital signs reviewed and stable ? ?Post vital signs: Reviewed and stable ? ?Last Vitals:  ?Vitals Value Taken Time  ?BP 100/78 06/16/21 1119  ?Temp 36.7 ?C 06/16/21 1119  ?Pulse 76 06/16/21 1119  ?Resp 17 06/16/21 1119  ?SpO2 100 % 06/16/21 1119  ? ? ?Last Pain:  ?Vitals:  ? 06/16/21 1119  ?TempSrc: Oral  ?PainSc:   ?   ? ?Patients Stated Pain Goal: 7 (06/16/21 1021) ? ?Complications: No notable events documented. ?

## 2021-06-17 LAB — SURGICAL PATHOLOGY

## 2021-06-21 ENCOUNTER — Encounter (HOSPITAL_COMMUNITY): Payer: Self-pay | Admitting: Internal Medicine

## 2021-07-08 DIAGNOSIS — I10 Essential (primary) hypertension: Secondary | ICD-10-CM | POA: Diagnosis not present

## 2021-07-17 DIAGNOSIS — G4733 Obstructive sleep apnea (adult) (pediatric): Secondary | ICD-10-CM | POA: Diagnosis not present

## 2021-08-16 DIAGNOSIS — G4733 Obstructive sleep apnea (adult) (pediatric): Secondary | ICD-10-CM | POA: Diagnosis not present

## 2021-09-16 DIAGNOSIS — G4733 Obstructive sleep apnea (adult) (pediatric): Secondary | ICD-10-CM | POA: Diagnosis not present

## 2021-10-07 ENCOUNTER — Encounter (INDEPENDENT_AMBULATORY_CARE_PROVIDER_SITE_OTHER): Payer: Self-pay | Admitting: Gastroenterology

## 2021-10-25 DIAGNOSIS — G4733 Obstructive sleep apnea (adult) (pediatric): Secondary | ICD-10-CM | POA: Diagnosis not present

## 2021-11-18 ENCOUNTER — Ambulatory Visit (INDEPENDENT_AMBULATORY_CARE_PROVIDER_SITE_OTHER): Payer: BC Managed Care – PPO | Admitting: Gastroenterology

## 2021-11-25 ENCOUNTER — Encounter (INDEPENDENT_AMBULATORY_CARE_PROVIDER_SITE_OTHER): Payer: Self-pay | Admitting: Gastroenterology

## 2021-11-25 ENCOUNTER — Ambulatory Visit (INDEPENDENT_AMBULATORY_CARE_PROVIDER_SITE_OTHER): Payer: BC Managed Care – PPO | Admitting: Gastroenterology

## 2021-11-25 VITALS — BP 125/78 | HR 87 | Temp 97.6°F | Ht 70.0 in | Wt 182.2 lb

## 2021-11-25 DIAGNOSIS — R198 Other specified symptoms and signs involving the digestive system and abdomen: Secondary | ICD-10-CM | POA: Diagnosis not present

## 2021-11-25 DIAGNOSIS — R140 Abdominal distension (gaseous): Secondary | ICD-10-CM | POA: Diagnosis not present

## 2021-11-25 DIAGNOSIS — R1084 Generalized abdominal pain: Secondary | ICD-10-CM

## 2021-11-25 NOTE — Patient Instructions (Signed)
Please continue to avoid known trigger foods, I have provided low FODMAP food guide which can help with this You can continue dicyclomine at bedtime  We will check celiac panel to rule out celiac disease as contributor of your symptoms  Follow up 1 year

## 2021-11-25 NOTE — Progress Notes (Signed)
Referring Provider: Sharilyn Sites, MD Primary Care Physician:  Sharilyn Sites, MD Primary GI Physician: Jenetta Downer  Chief Complaint  Patient presents with   Follow-up    Patient here today for a follow up on Tcs done on 06/16/2021. Denies any current gi issues.    HPI:   Terry Moses is a 51 y.o. male with past medical history of  GERD, Heart murmur, high cholesterol, HTN, Sleep apnea.   Patient presenting today for follow up of abdominal discomfort.   History: Seen last in February 2023 with abdominal discomfort, bloating x2 years and to setup screening TCS. Reported unsettled/gurgling stomach with eating, gas x did not provide relief, PCP took him off PPI which increased his GERD symptoms. Set up for colonoscopy, advised to try low FODMAP diet, bentyl '10mg'$  TID PRN, IB gard as needed for bloating, continued on PPI 2-3x/week with good results.   Present:  Patient States he is feeling better,taking bentyl at night as it makes him drowsy but does feel that it helps some with his symptoms. He has never had celiac testing in the past, having BMs twice daily, sometimes with looser stools but without diarrhea. Still has rumbling, some bloating but denies abdominal pain. Unsure if he tried low FODMAP diet after last OV. No red flag symptoms. Patient denies melena, hematochezia, nausea, vomiting, diarrhea, constipation, dysphagia, odyonophagia, early satiety or weight loss.   Last Colonoscopy:- 06/16/21 Two small polyps in the rectum and in the transverse colon- tubular adenoma and hyperplastic polyp - External hemorrhoids. - Anal papilla(e) were hypertrophied Last Endoscopy:  Recommendations:  Repeat in 7 years  Past Medical History:  Diagnosis Date   Anxiety    GERD (gastroesophageal reflux disease)    Headache(784.0)    Heart murmur    High cholesterol    High cholesterol    Hypertension    Sleep apnea     Past Surgical History:  Procedure Laterality Date   COLONOSCOPY WITH  PROPOFOL N/A 06/16/2021   Procedure: COLONOSCOPY WITH PROPOFOL;  Surgeon: Rogene Houston, MD;  Location: [facility];  Service: Endoscopy;  Laterality: N/A;  12:30 ASA 1   ESOPHAGOGASTRODUODENOSCOPY N/A 07/11/2012   Procedure: ESOPHAGOGASTRODUODENOSCOPY (EGD);  Surgeon: Rogene Houston, MD;  Location: [facility];  Service: Endoscopy;  Laterality: N/A;  150   POLYPECTOMY  06/16/2021   Procedure: POLYPECTOMY INTESTINAL;  Surgeon: Rogene Houston, MD;  Location: [facility];  Service: Endoscopy;;  transverse colon polyp; rectal colon polyp    Varicele     WISDOM TOOTH EXTRACTION      Current Outpatient Medications  Medication Sig Dispense Refill   acetaminophen (TYLENOL) 500 MG tablet Take 1,000 mg by mouth every 6 (six) hours as needed for moderate pain.     diclofenac Sodium (VOLTAREN) 1 % GEL Apply 1 application topically 4 (four) times daily as needed (pain).     dicyclomine (BENTYL) 10 MG capsule Take 1 capsule (10 mg total) by mouth 3 (three) times daily with meals as needed for spasms. 90 capsule 1   fluticasone (FLONASE) 50 MCG/ACT nasal spray Place 1 spray into both nostrils daily.     ibuprofen (ADVIL) 200 MG tablet Take 3 tablets (600 mg total) by mouth every 6 (six) hours as needed for moderate pain. 30 tablet 0   levocetirizine (XYZAL) 5 MG tablet Take 5 mg by mouth daily as needed for allergies.     Multiple Vitamin (MULTIVITAMIN) capsule Take 1 capsule by mouth daily.  neomycin-polymyxin-hydrocortisone (CORTISPORIN) 3.5-10000-1 OTIC suspension Place 4 drops into both ears at bedtime.     omeprazole (PRILOSEC) 40 MG capsule Take 40 mg by mouth daily.     rosuvastatin (CRESTOR) 20 MG tablet Take 20 mg by mouth daily.     Simethicone (GAS-X PO) Take 2 tablets by mouth daily as needed (gas).     sodium chloride (OCEAN) 0.65 % SOLN nasal spray Place 1 spray into both nostrils as needed for congestion.     valsartan (DIOVAN) 160 MG tablet Take 160 mg by mouth daily.      meclizine (ANTIVERT) 25 MG tablet Take 25 mg by mouth every 6 (six) hours as needed for dizziness. (Patient not taking: Reported on 11/25/2021)     No current facility-administered medications for this visit.    Allergies as of 11/25/2021 - Review Complete 11/25/2021  Allergen Reaction Noted   Penicillins Anaphylaxis 06/28/2012    Family History  Problem Relation Age of Onset   Hyperlipidemia Mother    Coronary artery disease Father    COPD Father     Social History   Socioeconomic History   Marital status: Married    Spouse name: Not on file   Number of children: Not on file   Years of education: Not on file   Highest education level: Not on file  Occupational History   Not on file  Tobacco Use   Smoking status: Every Day    Packs/day: 1.00    Years: 25.00    Total pack years: 25.00    Types: Cigarettes   Smokeless tobacco: Former    Quit date: 08/22/2011  Vaping Use   Vaping Use: Never used  Substance and Sexual Activity   Alcohol use: Yes    Comment: 5-6 cans a week   Drug use: No   Sexual activity: Yes  Other Topics Concern   Not on file  Social History Narrative   Not on file   Social Determinants of Health   Financial Resource Strain: Not on file  Food Insecurity: Not on file  Transportation Needs: Not on file  Physical Activity: Not on file  Stress: Not on file  Social Connections: Not on file    Review of systems General: negative for malaise, night sweats, fever, chills, weight los Neck: Negative for lumps, goiter, pain and significant neck swelling Resp: Negative for cough, wheezing, dyspnea at rest CV: Negative for chest pain, leg swelling, palpitations, orthopnea GI: denies melena, hematochezia, nausea, vomiting, diarrhea, constipation, dysphagia, odyonophagia, early satiety or unintentional weight loss. +abdominal rumbling +bloating +looser stools MSK: Negative for joint pain or swelling, back pain, and muscle pain. Derm: Negative for itching  or rash Psych: Denies depression, anxiety, memory loss, confusion. No homicidal or suicidal ideation.  Heme: Negative for prolonged bleeding, bruising easily, and swollen nodes. Endocrine: Negative for cold or heat intolerance, polyuria, polydipsia and goiter. Neuro: negative for tremor, gait imbalance, syncope and seizures. The remainder of the review of systems is noncontributory.  Physical Exam: BP 125/78 (BP Location: Left Arm, Patient Position: Sitting, Cuff Size: Large)   Pulse 87   Temp 97.6 F (36.4 C) (Oral)   Ht '5\' 10"'$  (1.778 m)   Wt 182 lb 3.2 oz (82.6 kg)   BMI 26.14 kg/m  General:   Alert and oriented. No distress noted. Pleasant and cooperative.  Head:  Normocephalic and atraumatic. Eyes:  Conjuctiva clear without scleral icterus. Mouth:  Oral mucosa pink and moist. Good dentition. No lesions. Heart: Normal  rate and rhythm, s1 and s2 heart sounds present.  Lungs: Clear lung sounds in all lobes. Respirations equal and unlabored. Abdomen:  +BS, soft, non-tender and non-distended. No rebound or guarding. No HSM or masses noted. Derm: No palmar erythema or jaundice Msk:  Symmetrical without gross deformities. Normal posture. Extremities:  Without edema. Neurologic:  Alert and  oriented x4 Psych:  Alert and cooperative. Normal mood and affect.  Invalid input(s): "6 MONTHS"   ASSESSMENT: ARIEN BENINCASA is a 51 y.o. male presenting today for follow up of of abdominal discomfort.  Patient feels he is doing well, colonoscopy in march with only two small polyps and some hemorrhoids. He continues to have some gurgling and increased noise in his stomach,some bloating and some looser stools, however, denies abdominal pain or diarrhea. Appetite is good. Taking bentyl nightly with good results, cannot take it more frequently as it causes drowsiness, can continue this with good results. Encouraged again to look at low FODMAP diet and try to pinpoint triggers that correlate his  symptoms. He should avoid known triggers. Has never had celiac testing, will proceed with this to rule out celiac disease as underlying contributor.    PLAN:  Low fodmap  2. Bentyl QHS  3.celiac panel 4. Avoid known triggers  All questions were answered, patient verbalized understanding and is in agreement with plan as outlined above.   Follow Up: 1 year  Jerett Odonohue L. Alver Sorrow, MSN, APRN, AGNP-C Adult-Gerontology Nurse Practitioner [facility]

## 2021-11-26 LAB — CELIAC DISEASE PANEL
(tTG) Ab, IgA: <1 U/mL
(tTG) Ab, IgG: <1 U/mL
Gliadin IgA: 1.5 U/mL
Gliadin IgG: <1 U/mL
Immunoglobulin A: 147 mg/dL (ref 47–310)

## 2021-11-27 DIAGNOSIS — R198 Other specified symptoms and signs involving the digestive system and abdomen: Secondary | ICD-10-CM | POA: Insufficient documentation

## 2022-01-07 DIAGNOSIS — Z713 Dietary counseling and surveillance: Secondary | ICD-10-CM | POA: Diagnosis not present

## 2022-04-01 DIAGNOSIS — E663 Overweight: Secondary | ICD-10-CM | POA: Diagnosis not present

## 2022-04-01 DIAGNOSIS — I10 Essential (primary) hypertension: Secondary | ICD-10-CM | POA: Diagnosis not present

## 2022-04-01 DIAGNOSIS — Z0000 Encounter for general adult medical examination without abnormal findings: Secondary | ICD-10-CM | POA: Diagnosis not present

## 2022-04-01 DIAGNOSIS — E119 Type 2 diabetes mellitus without complications: Secondary | ICD-10-CM | POA: Diagnosis not present

## 2022-04-01 DIAGNOSIS — K589 Irritable bowel syndrome without diarrhea: Secondary | ICD-10-CM | POA: Diagnosis not present

## 2022-04-01 DIAGNOSIS — Z6825 Body mass index (BMI) 25.0-25.9, adult: Secondary | ICD-10-CM | POA: Diagnosis not present

## 2022-04-01 DIAGNOSIS — E782 Mixed hyperlipidemia: Secondary | ICD-10-CM | POA: Diagnosis not present

## 2022-04-01 DIAGNOSIS — E785 Hyperlipidemia, unspecified: Secondary | ICD-10-CM | POA: Diagnosis not present

## 2022-04-01 DIAGNOSIS — G4733 Obstructive sleep apnea (adult) (pediatric): Secondary | ICD-10-CM | POA: Diagnosis not present

## 2022-04-01 DIAGNOSIS — K219 Gastro-esophageal reflux disease without esophagitis: Secondary | ICD-10-CM | POA: Diagnosis not present

## 2022-11-28 ENCOUNTER — Ambulatory Visit (INDEPENDENT_AMBULATORY_CARE_PROVIDER_SITE_OTHER): Payer: BC Managed Care – PPO | Admitting: Gastroenterology

## 2022-12-02 DIAGNOSIS — H6591 Unspecified nonsuppurative otitis media, right ear: Secondary | ICD-10-CM | POA: Diagnosis not present

## 2022-12-02 DIAGNOSIS — Z6825 Body mass index (BMI) 25.0-25.9, adult: Secondary | ICD-10-CM | POA: Diagnosis not present

## 2022-12-02 DIAGNOSIS — H6091 Unspecified otitis externa, right ear: Secondary | ICD-10-CM | POA: Diagnosis not present

## 2023-04-03 DIAGNOSIS — Z1331 Encounter for screening for depression: Secondary | ICD-10-CM | POA: Diagnosis not present

## 2023-04-03 DIAGNOSIS — K589 Irritable bowel syndrome without diarrhea: Secondary | ICD-10-CM | POA: Diagnosis not present

## 2023-04-03 DIAGNOSIS — M545 Low back pain, unspecified: Secondary | ICD-10-CM | POA: Diagnosis not present

## 2023-04-03 DIAGNOSIS — Z6826 Body mass index (BMI) 26.0-26.9, adult: Secondary | ICD-10-CM | POA: Diagnosis not present

## 2023-04-03 DIAGNOSIS — G4733 Obstructive sleep apnea (adult) (pediatric): Secondary | ICD-10-CM | POA: Diagnosis not present

## 2023-04-03 DIAGNOSIS — I10 Essential (primary) hypertension: Secondary | ICD-10-CM | POA: Diagnosis not present

## 2023-04-03 DIAGNOSIS — Z0000 Encounter for general adult medical examination without abnormal findings: Secondary | ICD-10-CM | POA: Diagnosis not present

## 2023-04-03 DIAGNOSIS — R7309 Other abnormal glucose: Secondary | ICD-10-CM | POA: Diagnosis not present

## 2023-04-03 DIAGNOSIS — E785 Hyperlipidemia, unspecified: Secondary | ICD-10-CM | POA: Diagnosis not present

## 2023-04-03 DIAGNOSIS — E663 Overweight: Secondary | ICD-10-CM | POA: Diagnosis not present

## 2023-04-03 DIAGNOSIS — K219 Gastro-esophageal reflux disease without esophagitis: Secondary | ICD-10-CM | POA: Diagnosis not present

## 2023-06-22 DIAGNOSIS — K640 First degree hemorrhoids: Secondary | ICD-10-CM | POA: Diagnosis not present

## 2023-09-05 ENCOUNTER — Ambulatory Visit: Admitting: Internal Medicine

## 2024-04-03 DIAGNOSIS — Z125 Encounter for screening for malignant neoplasm of prostate: Secondary | ICD-10-CM | POA: Diagnosis not present

## 2024-04-03 DIAGNOSIS — E785 Hyperlipidemia, unspecified: Secondary | ICD-10-CM | POA: Diagnosis not present

## 2024-04-03 DIAGNOSIS — D72829 Elevated white blood cell count, unspecified: Secondary | ICD-10-CM | POA: Diagnosis not present

## 2024-04-05 ENCOUNTER — Encounter (HOSPITAL_BASED_OUTPATIENT_CLINIC_OR_DEPARTMENT_OTHER): Payer: Self-pay | Admitting: Family Medicine

## 2024-04-05 ENCOUNTER — Other Ambulatory Visit (HOSPITAL_BASED_OUTPATIENT_CLINIC_OR_DEPARTMENT_OTHER): Payer: Self-pay | Admitting: Family Medicine

## 2024-04-05 DIAGNOSIS — F17200 Nicotine dependence, unspecified, uncomplicated: Secondary | ICD-10-CM

## 2024-04-05 DIAGNOSIS — Z0000 Encounter for general adult medical examination without abnormal findings: Secondary | ICD-10-CM

## 2024-04-22 ENCOUNTER — Ambulatory Visit (HOSPITAL_COMMUNITY)
Admission: RE | Admit: 2024-04-22 | Discharge: 2024-04-22 | Disposition: A | Discharge status: Home / Self Care | Payer: Self-pay | Source: Ambulatory Visit | Attending: Family Medicine | Admitting: Family Medicine

## 2024-04-22 DIAGNOSIS — F17200 Nicotine dependence, unspecified, uncomplicated: Secondary | ICD-10-CM | POA: Insufficient documentation

## 2024-04-22 DIAGNOSIS — Z0000 Encounter for general adult medical examination without abnormal findings: Secondary | ICD-10-CM | POA: Insufficient documentation
